# Patient Record
Sex: Male | Born: 1965 | Race: White | Hispanic: No | Marital: Married | Smoking: Never smoker
Health system: Southern US, Community
[De-identification: ages and names within clinical notes are randomized; demographics above are authoritative.]

## PROBLEM LIST (undated history)

## (undated) DIAGNOSIS — M1990 Unspecified osteoarthritis, unspecified site: Secondary | ICD-10-CM

## (undated) HISTORY — PX: COLONOSCOPY: SHX174

---

## 2004-08-03 ENCOUNTER — Emergency Department (HOSPITAL_COMMUNITY): Admission: EM | Admit: 2004-08-03 | Discharge: 2004-08-03 | Payer: Self-pay | Admitting: Family Medicine

## 2005-08-20 DIAGNOSIS — C4492 Squamous cell carcinoma of skin, unspecified: Secondary | ICD-10-CM

## 2005-08-20 HISTORY — DX: Squamous cell carcinoma of skin, unspecified: C44.92

## 2007-03-08 ENCOUNTER — Emergency Department (HOSPITAL_COMMUNITY): Admission: EM | Admit: 2007-03-08 | Discharge: 2007-03-09 | Payer: Self-pay | Admitting: Emergency Medicine

## 2007-04-30 ENCOUNTER — Ambulatory Visit: Payer: Self-pay | Admitting: Internal Medicine

## 2007-05-01 ENCOUNTER — Encounter: Payer: Self-pay | Admitting: Internal Medicine

## 2007-05-06 ENCOUNTER — Encounter (INDEPENDENT_AMBULATORY_CARE_PROVIDER_SITE_OTHER): Payer: Self-pay | Admitting: *Deleted

## 2007-05-06 LAB — CONVERTED CEMR LAB
ALT: 30 units/L (ref 0–53)
AST: 28 units/L (ref 0–37)
Albumin: 4.5 g/dL (ref 3.5–5.2)
Alkaline Phosphatase: 76 units/L (ref 39–117)
BUN: 13 mg/dL (ref 6–23)
Basophils Absolute: 0 10*3/uL (ref 0.0–0.1)
Basophils Relative: 0 % (ref 0–1)
Bilirubin, Direct: 0.1 mg/dL (ref 0.0–0.3)
CO2: 27 meq/L (ref 19–32)
Calcium: 9.2 mg/dL (ref 8.4–10.5)
Chloride: 101 meq/L (ref 96–112)
Cholesterol: 185 mg/dL (ref 0–200)
Creatinine, Ser: 0.86 mg/dL (ref 0.40–1.50)
Eosinophils Absolute: 0.3 10*3/uL (ref 0.0–0.7)
Eosinophils Relative: 3 % (ref 0–5)
Free T4: 1.13 ng/dL (ref 0.89–1.80)
Glucose, Bld: 80 mg/dL (ref 70–99)
HCT: 50.9 % (ref 39.0–52.0)
HDL: 47 mg/dL (ref >39)
Hemoglobin: 16.6 g/dL (ref 13.0–17.0)
Indirect Bilirubin: 0.6 mg/dL (ref 0.0–0.9)
LDL Cholesterol: 115 mg/dL — ABNORMAL HIGH (ref 0–99)
Lymphocytes Relative: 34 % (ref 12–46)
Lymphs Abs: 2.5 10*3/uL (ref 0.7–3.3)
MCHC: 32.6 g/dL (ref 30.0–36.0)
MCV: 94.3 fL (ref 78.0–100.0)
Monocytes Absolute: 0.4 10*3/uL (ref 0.2–0.7)
Monocytes Relative: 6 % (ref 3–11)
Neutro Abs: 4.2 10*3/uL (ref 1.7–7.7)
Neutrophils Relative %: 57 % (ref 43–77)
Platelets: 218 10*3/uL (ref 150–400)
Potassium: 3.8 meq/L (ref 3.5–5.3)
RBC: 5.4 M/uL (ref 4.22–5.81)
RDW: 13.5 % (ref 11.5–14.0)
Sodium: 142 meq/L (ref 135–145)
T3, Free: 3 pg/mL (ref 2.3–4.2)
TSH: 2.486 microintl units/mL (ref 0.350–5.50)
Total Bilirubin: 0.7 mg/dL (ref 0.3–1.2)
Total CHOL/HDL Ratio: 3.9
Total Protein: 8 g/dL (ref 6.0–8.3)
Triglycerides: 115 mg/dL (ref <150)
VLDL: 23 mg/dL (ref 0–40)
WBC: 7.4 10*3/uL (ref 4.0–10.5)

## 2007-05-13 ENCOUNTER — Ambulatory Visit: Payer: Self-pay | Admitting: Cardiology

## 2007-06-18 ENCOUNTER — Ambulatory Visit: Payer: Self-pay | Admitting: Cardiology

## 2009-02-12 IMAGING — CR DG SHOULDER 2+V*L*
3 series · 3 of 3 positions shown · non-contrast
Comparison: none

HISTORY: Left arm and shoulder pain, no known injury

LEFT SHOULDER 3 VIEWS:
Mild bony demineralization diffusely.
AC joint alignment normal.
No glenohumeral fracture or dislocation.
Adjacent ribs intact.

[w shoulder ap internal left]
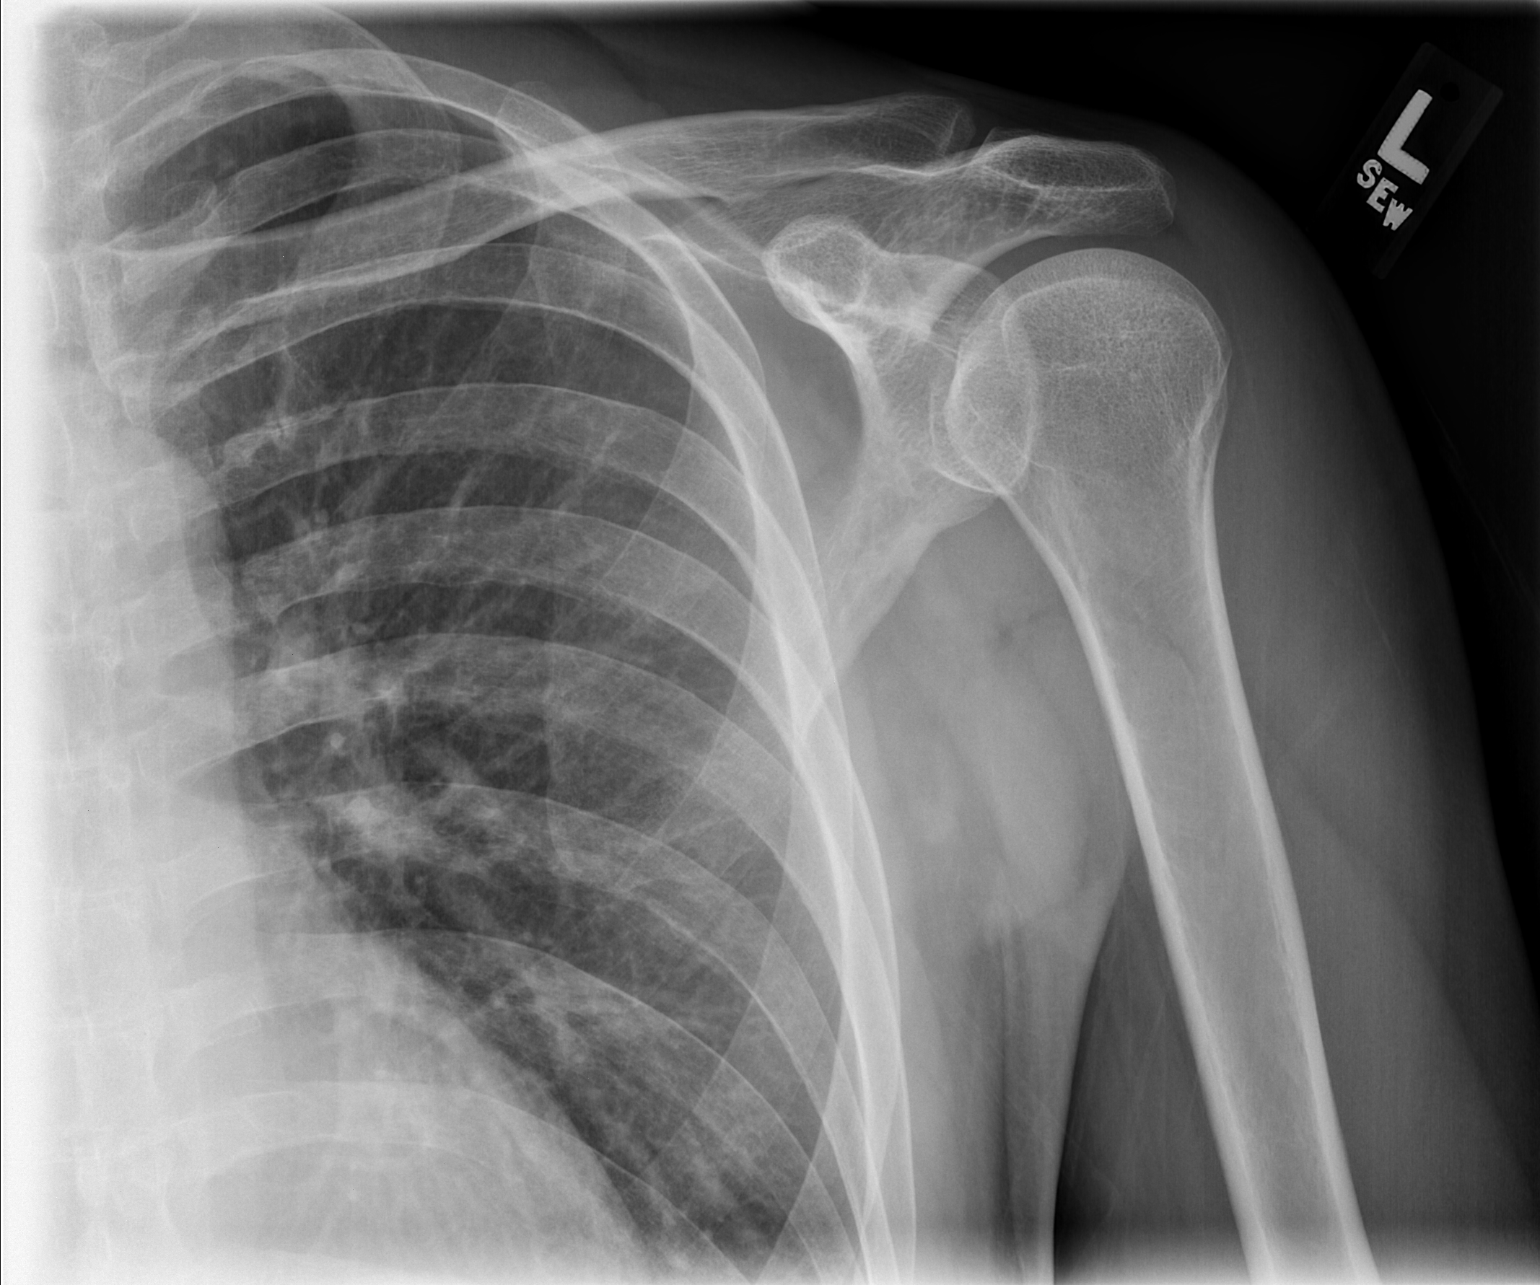

[w shoulder ap external left]
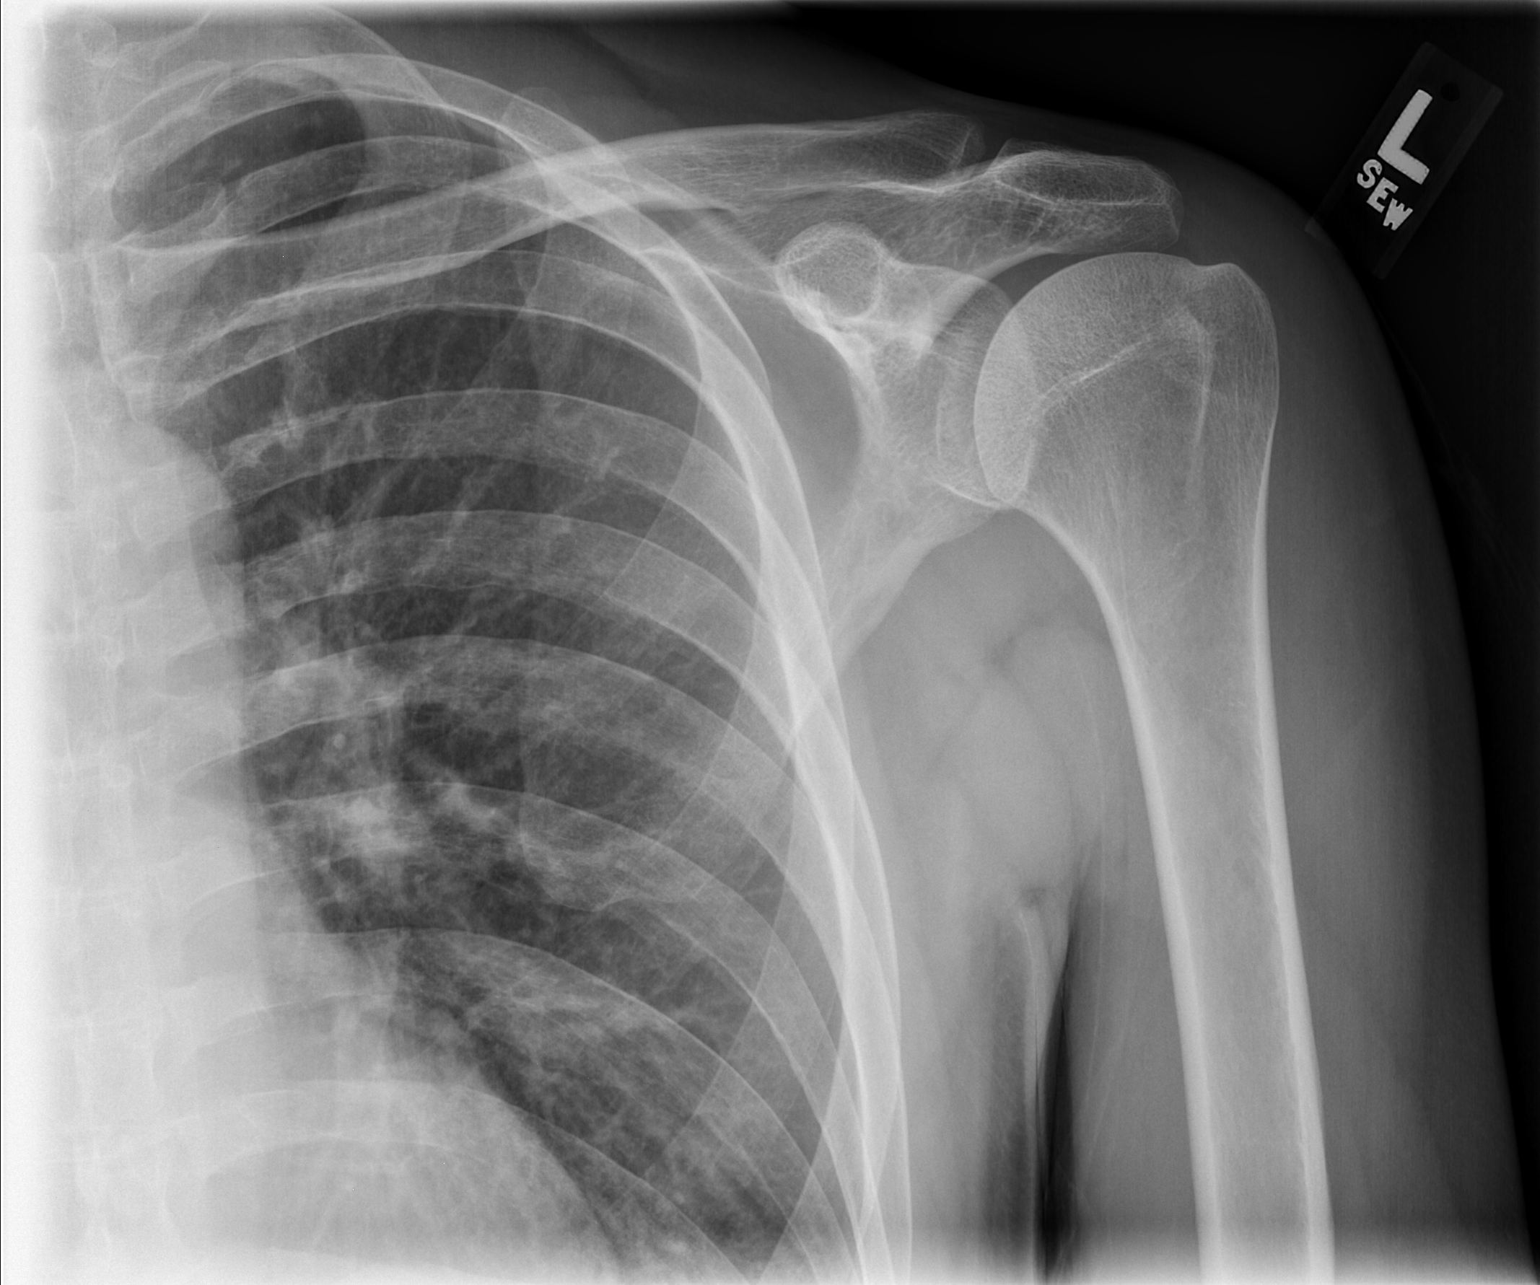

[w shoulder y view left]
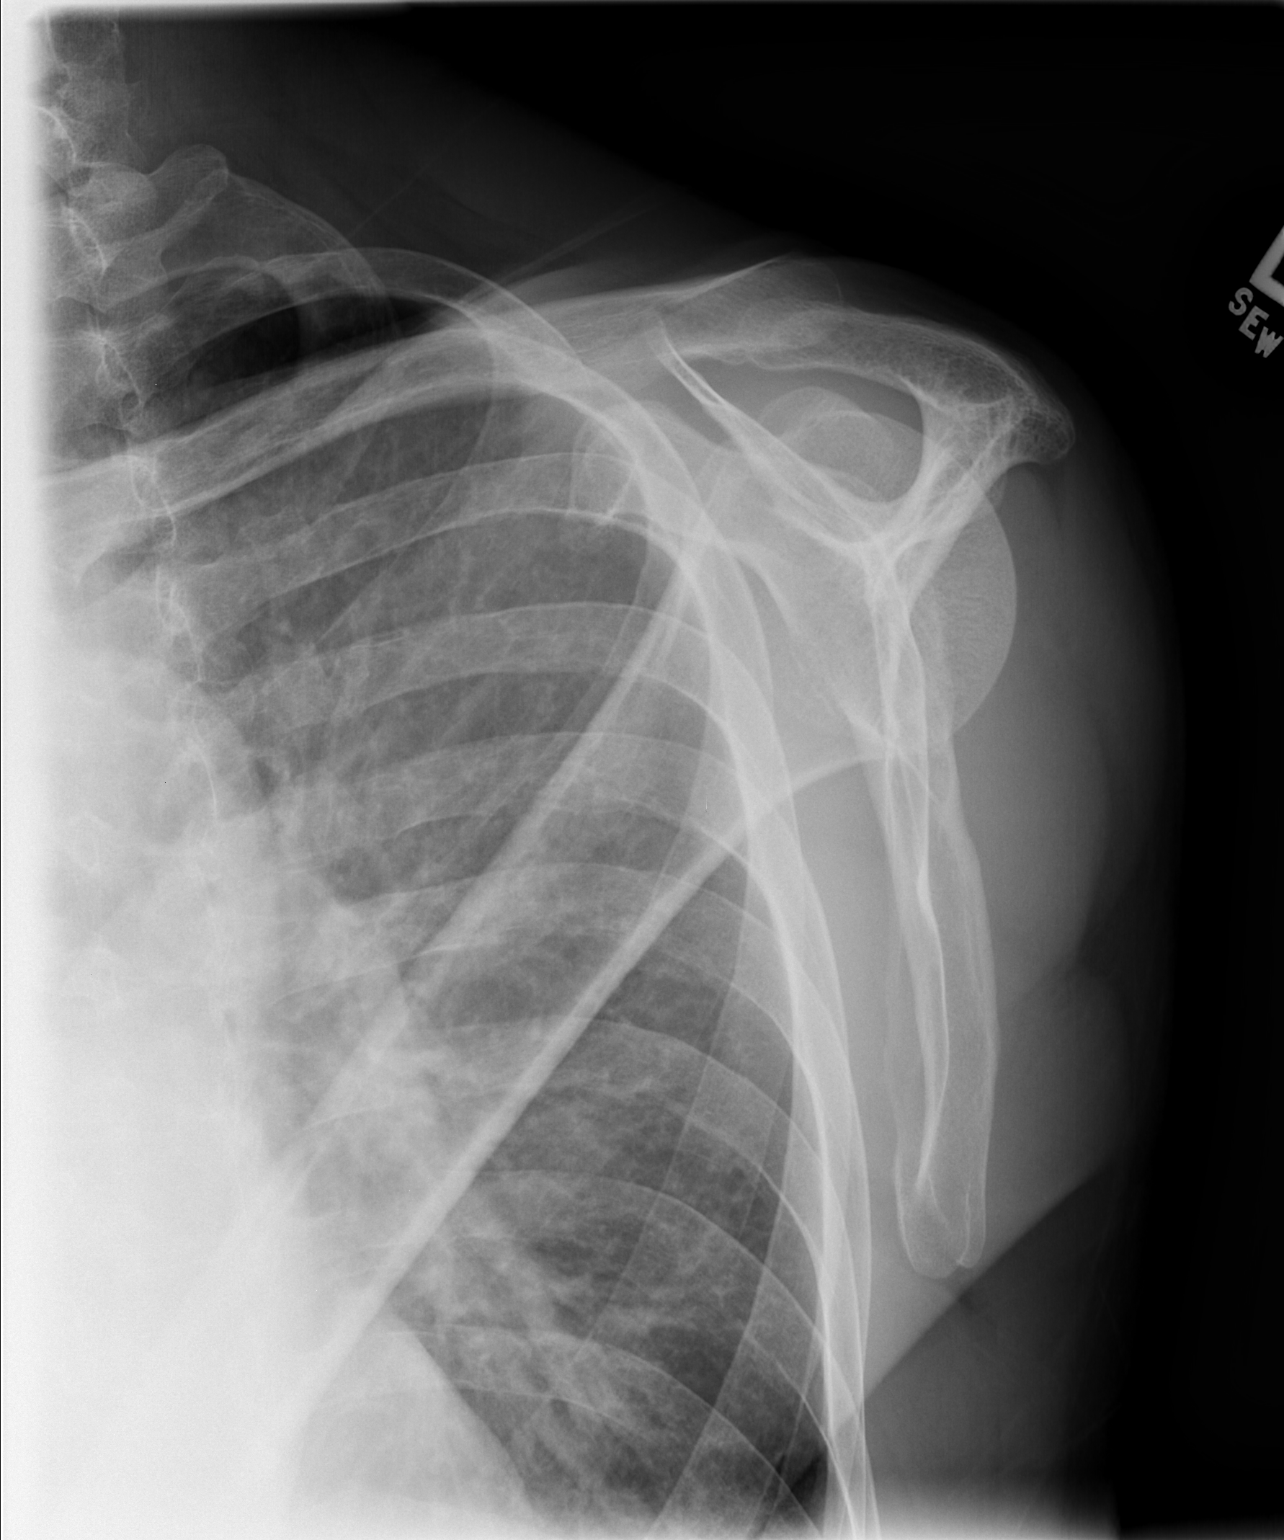

[3 of 3 positions shown; findings below may reference images not displayed]

IMPRESSION: No acute abnormalities.

LEFT HUMERUS 2 VIEWS:

Mild bony demineralization.
No fracture, dislocation, or bone destruction.
Soft tissues unremarkable.
IMPRESSION: No acute abnormalities.
Diffuse bony demineralization.

## 2009-09-12 ENCOUNTER — Ambulatory Visit: Payer: Self-pay | Admitting: Internal Medicine

## 2010-03-26 ENCOUNTER — Ambulatory Visit: Payer: Self-pay | Admitting: Internal Medicine

## 2010-03-26 DIAGNOSIS — E785 Hyperlipidemia, unspecified: Secondary | ICD-10-CM | POA: Insufficient documentation

## 2010-11-17 LAB — CONVERTED CEMR LAB
ALT: 22 units/L (ref 0–53)
AST: 26 units/L (ref 0–37)
Albumin: 4.3 g/dL (ref 3.5–5.2)
Alkaline Phosphatase: 70 units/L (ref 39–117)
BUN: 11 mg/dL (ref 6–23)
Basophils Absolute: 0 10*3/uL (ref 0.0–0.1)
Basophils Relative: 0.3 % (ref 0.0–3.0)
Bilirubin, Direct: 0.2 mg/dL (ref 0.0–0.3)
CO2: 32 meq/L (ref 19–32)
Calcium: 9.1 mg/dL (ref 8.4–10.5)
Chloride: 104 meq/L (ref 96–112)
Cholesterol, target level: 200 mg/dL
Cholesterol: 216 mg/dL — ABNORMAL HIGH (ref 0–200)
Creatinine, Ser: 0.7 mg/dL (ref 0.4–1.5)
Direct LDL: 143.9 mg/dL
Eosinophils Absolute: 0.5 10*3/uL (ref 0.0–0.7)
Eosinophils Relative: 4.9 % (ref 0.0–5.0)
GFR calc non Af Amer: 125.22 mL/min (ref >60)
Glucose, Bld: 85 mg/dL (ref 70–99)
HCT: 46.8 % (ref 39.0–52.0)
HDL goal, serum: 40 mg/dL
HDL: 49.2 mg/dL (ref >39.00)
Hemoglobin: 16.2 g/dL (ref 13.0–17.0)
LDL Goal: 160 mg/dL
Lymphocytes Relative: 25.1 % (ref 12.0–46.0)
Lymphs Abs: 2.3 10*3/uL (ref 0.7–4.0)
MCHC: 34.5 g/dL (ref 30.0–36.0)
MCV: 93.7 fL (ref 78.0–100.0)
Monocytes Absolute: 0.5 10*3/uL (ref 0.1–1.0)
Monocytes Relative: 5.7 % (ref 3.0–12.0)
Neutro Abs: 6 10*3/uL (ref 1.4–7.7)
Neutrophils Relative %: 64 % (ref 43.0–77.0)
Platelets: 181 10*3/uL (ref 150.0–400.0)
Potassium: 4.1 meq/L (ref 3.5–5.1)
RBC: 5 M/uL (ref 4.22–5.81)
RDW: 13.1 % (ref 11.5–14.6)
Sodium: 143 meq/L (ref 135–145)
TSH: 2.08 microintl units/mL (ref 0.35–5.50)
Total Bilirubin: 1.1 mg/dL (ref 0.3–1.2)
Total CHOL/HDL Ratio: 4
Total Protein: 7.2 g/dL (ref 6.0–8.3)
Triglycerides: 107 mg/dL (ref 0.0–149.0)
VLDL: 21.4 mg/dL (ref 0.0–40.0)
WBC: 9.4 10*3/uL (ref 4.5–10.5)

## 2010-11-19 NOTE — Assessment & Plan Note (Signed)
Summary: cpx/ns/kdc   Vital Signs:  Patient profile:   45 year old male Height:      73.5 inches Weight:      165.4 pounds BMI:     21.60 Temp:     98.3 degrees F oral Pulse rate:   64 / minute Resp:     14 per minute BP sitting:   122 / 80  (left arm) Cuff size:   large  Vitals Entered By: Shonna Chock (March 26, 2010 8:33 AM)  CC: CPX with fasting labs , General Medical Evaluation, Lipid Management Comments REVIEWED MED LIST, PATIENT AGREED DOSE AND INSTRUCTION CORRECT    CC:  CPX with fasting labs , General Medical Evaluation, and Lipid Management.  History of Present Illness: Cameron Nguyen is here for a physical; he is asymptomatic.   Lipid Management History:      Negative NCEP/ATP III risk factors include male age less than 38 years old, non-diabetic, no family history for ischemic heart disease, non-tobacco-user status, non-hypertensive, no ASHD (atherosclerotic heart disease), no prior stroke/TIA, no peripheral vascular disease, and no history of aortic aneurysm.     Preventive Screening-Counseling & Management  Alcohol-Tobacco     Smoking Status: quit  Caffeine-Diet-Exercise     Does Patient Exercise: yes  Allergies (verified): No Known Drug Allergies  Past History:  Past Medical History: Unremarkable Hyperlipidemia: LDL 115 in 2008. [place] LDL goal = < 160. No FH premature  CAD.  Past Surgical History: Precancerous facial lesions removed  2006  by  Dr Neena Rhymes, Plastic Surgery, resected a benign facial lesion 2006; Wisdom Teeth extraction  Family History: Father:  HTN; Mother: living; MGM: DM; MGF: CAD ; PGF: CVA;  PGM: pancreatic cancer  Social History: Occupation:General Designer, television/film set Married Former Smoker:quit 1991, former social smoker Alcohol use-no Regular exercise-yes: walk/run 4-5X/week Does Patient Exercise:  yes  Review of Systems  The patient denies anorexia, fever, vision loss, decreased hearing, hoarseness,  chest pain, syncope, peripheral edema, prolonged cough, headaches, hemoptysis, abdominal pain, melena, hematochezia, severe indigestion/heartburn, hematuria, suspicious skin lesions, depression, unusual weight change, abnormal bleeding, enlarged lymph nodes, and angioedema.         Weigh up 11# in past year. Rare dysphagia with meat , every 3-4 months. Some DOE climbing stairs rapidly , not with running up to 10 miles. Occasional LBP  w/o incontinence or paresthesias. S/P steroids by mouth 01/2010  from [facility] for sciatic  pain LLE. Occasional positional  stiffness R knee; no Rx.  Physical Exam  General:  Thin but well-nourished; alert,appropriate and cooperative throughout examination Head:  Normocephalic and atraumatic without obvious abnormalities. No apparent alopecia  Eyes:  No corneal or conjunctival inflammation noted.Perrla. Funduscopic exam benign, without hemorrhages, exudates or papilledema.  Ears:  External ear exam shows no significant lesions or deformities.  Otoscopic examination reveals clear canals, tympanic membranes are intact bilaterally without bulging, retraction, inflammation or discharge. Hearing is grossly normal bilaterally. Nose:  External nasal examination shows no deformity or inflammation. Nasal mucosa are pink and moist without lesions or exudates. Mouth:  Oral mucosa and oropharynx without lesions or exudates.  Teeth in good repair. Osteomata of mandilble Neck:  No deformities, masses, or tenderness noted. Lungs:  Normal respiratory effort, chest expands symmetrically. Lungs are clear to auscultation, no crackles or wheezes. Heart:  Normal rate and regular rhythm. S1 and S2 normal without gallop, murmur, click, rub.S4 Abdomen:  Bowel sounds positive,abdomen soft and non-tender without masses, organomegaly or  hernias noted. Rectal:  No external abnormalities noted. Normal sphincter tone. No rectal masses or tenderness. Genitalia:  Testes bilaterally descended  without nodularity, tenderness or masses. No scrotal masses or lesions. No penis lesions or urethral discharge. Prostate:  Prostate gland firm and smooth, no enlargement but ULN size w/o  nodularity, tenderness, mass, asymmetry or induration. Msk:  No deformity or scoliosis noted of thoracic or lumbar spine.   Pulses:  R and L carotid,radial,dorsalis pedis and posterior tibial pulses are full and equal bilaterally Extremities:  No clubbing, cyanosis, edema, or deformity noted with normal full range of motion of all joints.   Neg SLR Neurologic:  alert & oriented X3, strength normal in all extremities, gait normal, and DTRs symmetrical and normal.   Skin:  Tatoo LUE. Keratosis R forearm  Cervical Nodes:  No lymphadenopathy noted Axillary Nodes:  No palpable lymphadenopathy Inguinal Nodes:  No significant adenopathy Psych:  memory intact for recent and remote, normally interactive, and good eye contact.     Impression & Recommendations:  Problem # 1:  ROUTINE GENERAL MEDICAL EXAM@HEALTH  CARE FACL (ICD-V70.0)  Orders: EKG w/ Interpretation (93000) Venipuncture (98119) TLB-Lipid Panel (80061-LIPID) TLB-BMP (Basic Metabolic Panel-BMET) (80048-METABOL) TLB-CBC Platelet - w/Differential (85025-CBCD) TLB-Hepatic/Liver Function Pnl (80076-HEPATIC) TLB-TSH (Thyroid Stimulating Hormone) (84443-TSH)  Lipid Assessment/Plan:      Based on NCEP/ATP III, the patient's risk factor category is "0-1 risk factors".  The patient's lipid goals are as follows: Total cholesterol goal is 200; LDL cholesterol goal is 160; HDL cholesterol goal is 40; Triglyceride goal is 150.  His LDL cholesterol goal has been met.    Patient Instructions: 1)  Verify family history & make additions/ corrections.

## 2011-03-04 NOTE — Procedures (Signed)
[facility]                              EXERCISE TREADMILL   Cameron Nguyen, Cameron Nguyen                       MRN:          [id number]  DATE:06/18/2007                            DOB:          [birth year]    PRIMARY CARE PHYSICIAN:  Dr. Alwyn Ren.   PROCEDURE:  Exercise treadmill test.   INDICATIONS FOR PROCEDURE:  Evaluate patient with chest pain.   DESCRIPTION OF PROCEDURE:  The patient was exercised using Bruce  protocol. I did accelerate this slightly. He was able to exercise for 12  minutes. This actually took him into stage 5. He achieved 15.3 mets. He  had a peak heart rate of 171, which was 94% of predicted. He had  appropriate blood pressure response to maximum of 154/74. He had a  normal heart rate recovery, no ischemic STT wave changes, and no chest  pain. The test was terminated because of fatigue.   CONCLUSION:  Negative, adequate exercise treadmill test. He had an  excellent exercise tolerance and no high risk features.   PLAN:  Based on the above, the patient is at low risk for obstructive  coronary disease or future cardiovascular events. I did give him a  prescription for exercise based on this. No further cardiac workup for  his chest pain is indicated. He will present to Dr. Alwyn Ren if this  continues, to evaluate probable non-anginal etiologies.     Rollene Rotunda, MD, [facility]  Electronically Signed    JH/MedQ  DD: 06/18/2007  DT: 06/19/2007  Job #: [id number]   cc:   Dr. Alwyn Ren

## 2011-03-04 NOTE — Assessment & Plan Note (Signed)
[facility]                            CARDIOLOGY OFFICE NOTE   Cameron Nguyen, Cameron Nguyen                       MRN:          [id number]  DATE:05/13/2007                            DOB:          1966-04-25    REASON FOR CONSULTATION:  Evaluate patient with chest discomfort and an  abnormal EKG.   HISTORY OF PRESENT ILLNESS:  The patient is a pleasant 45 year old  gentleman with no prior cardiac history.  He was in the emergency room  in May with discomfort.  This was in his left arm.  It was a persistent  discomfort.  He did rule out for myocardial infarction and had no  apparent EKG changes.  It was thought that possibly this was  musculoskeletal.  He has seen Dr. Alwyn Ren for evaluation of some chest  discomfort.  He says this has been going on sporadically for a year.  He  describes some in his left upper chest.  It may persist for up to a half  hour when it comes on.  It may go away very quickly.  He does not  describe any associated symptoms such as nausea, vomiting or  diaphoresis.  He does not have any palpitations with this.  He has had  no presyncope or syncope.  He does occasionally get isolated  palpitations, but this is a chronic pattern.  He can not make this  discomfort come on.  It comes at rest.  He has not had any increase in  this.  It has been a relatively chronic pattern over the last year.  He  did get an EKG that apparently demonstrated an early repolarization  pattern.  The patient is referred for further evaluation as he does have  these complaints and a sedentary lifestyle.   PAST MEDICAL HISTORY:  He has no history hypertension, diabetes or  hyperlipidemia.  He did have anemia many years ago.   PAST SURGICAL HISTORY:  None.   ALLERGIES:  QUESTIONABLY IV CONTRAST DYE (this made him nauseated, but I  do not think it caused a true anaphylactic reaction).   MEDICATIONS:  Multivitamin.   SOCIAL HISTORY:  The patient is a Barista.  He is  married with 2 children.  He has never really smoked cigarettes.  He  does not drink alcohol.   FAMILY HISTORY:  Noncontributory for early coronary artery disease.   REVIEW OF SYSTEMS:  As stated in the history of present illness and  negative for other systems.   PHYSICAL EXAMINATION:  The patient is in no distress.  Blood pressure 118/80, heart rate 67 and regular.  HEENT:  Eyes unremarkable, pupils equal, round and reactive to light,  fundi within normal limits, oral mucosa unremarkable.  NECK:  No jugular venous distension, wave form within normal limits,  carotid upstrokes brisk and symmetric, no bruits, no thyromegaly.  LYMPHATICS:  No cervical, axillary or inguinal adenopathy.  LUNGS:  Clear to auscultation bilaterally.  BACK:  No costovertebral angle tenderness.  CHEST:  Unremarkable.  HEART:  PMI not displaced or sustained, S1 and S2 within  normal limits,  no S3, no S4, no clicks, no rubs, no murmurs.  ABDOMEN:  Flat, positive bowel sounds normal in frequency and pitch, no  bruits, no rebound, no guarding, no midline pulsatile mass, no  hepatomegaly, no splenomegaly.  SKIN:  No rashes, no nodules.  EXTREMITIES:  2+ pulses throughout, no edema, no cyanosis or clubbing.  NEURO:  Oriented to person, place and time, cranial nerves II through  XII grossly intact, motor grossly intact.   EKG:  Sinus rhythm, rate 67, axis within normal limits, intervals within  normal limits, no acute ST-T wave changes.   ASSESSMENT/PLAN:  1. Chest discomfort.  The patient's chest discomfort is atypical.  He      has a low pretest probability for obstructive coronary disease.      However, he has a sedentary lifestyle.  I would like to perform an      exercise treadmill test on him.  This will allow me to screen for      coronary disease.  It will allow me to risk stratify and very      importantly it will allow me to give him a prescription for      exercise.  He and  I discussed this and he agrees to come back for      this.  2. Dyslipidemia.  The patient has a reasonable lipid profile.  He did      have a very slightly elevated LDL and we discussed the fact that      this could come down with diet and exercise.  I will discuss this      further with him at his treadmill.  3. Followup.  I will see him back at the time of his exercise      treadmill test.     Rollene Rotunda, MD, [facility]  Electronically Signed    JH/MedQ  DD: 05/13/2007  DT: 05/14/2007  Job #: [id number]   cc:   Titus Dubin. Alwyn Ren, MD,FACP,FCCP

## 2017-08-19 DIAGNOSIS — C4491 Basal cell carcinoma of skin, unspecified: Secondary | ICD-10-CM

## 2017-08-19 HISTORY — DX: Basal cell carcinoma of skin, unspecified: C44.91

## 2017-11-19 ENCOUNTER — Encounter (HOSPITAL_COMMUNITY): Payer: Self-pay | Admitting: Family Medicine

## 2017-11-19 ENCOUNTER — Ambulatory Visit (HOSPITAL_COMMUNITY)
Admission: EM | Admit: 2017-11-19 | Discharge: 2017-11-19 | Disposition: A | Discharge status: Home / Self Care | Payer: 59 | Attending: Physician Assistant | Admitting: Physician Assistant

## 2017-11-19 DIAGNOSIS — J069 Acute upper respiratory infection, unspecified: Secondary | ICD-10-CM

## 2017-11-19 MED ORDER — NAPROXEN 500 MG PO TABS: 500.0000 mg | ORAL_TABLET | Freq: Two times a day (BID) | ORAL | 0 refills | Status: AC

## 2017-11-19 NOTE — ED Triage Notes (Signed)
Pt here for URI symptoms since Monday. Not taking anything OTC for relief.

## 2017-11-19 NOTE — ED Provider Notes (Signed)
11/19/2017 6:32 PM   DOB: June 29, 1966 / MRN: [id number]  SUBJECTIVE:  Cameron Nguyen is a 52 y.o. male presenting for URI symptoms that started roughly 4 days ago.  Denies fever.  Has not tried any medications.  Tells me that his worst symptom is nasal congestion today.  He has No Known Allergies.   He  has no past medical history on file.    He  reports that  has never smoked. He does not have any smokeless tobacco history on file. He  has no sexual activity history on file. The patient  has no past surgical history on file.  His family history is not on file.  ROS  Negative for fever, chills, diaphoresis, chest pain, shortness of breath, leg swelling  OBJECTIVE:  BP 130/78   Pulse 74   Temp 98.8 F (37.1 C)   Resp 18   SpO2 98%   Physical Exam  Constitutional: He appears well-developed. He is active and cooperative.  Non-toxic appearance.  HENT:  Right Ear: Hearing, tympanic membrane, external ear and ear canal normal.  Left Ear: Hearing, tympanic membrane, external ear and ear canal normal.  Nose: Nose normal. Right sinus exhibits no maxillary sinus tenderness and no frontal sinus tenderness. Left sinus exhibits no maxillary sinus tenderness and no frontal sinus tenderness.  Mouth/Throat: Uvula is midline, oropharynx is clear and moist and mucous membranes are normal. No oropharyngeal exudate, posterior oropharyngeal edema or tonsillar abscesses.  Eyes: Conjunctivae are normal. Pupils are equal, round, and reactive to light.  Cardiovascular: Normal rate, regular rhythm, S1 normal, S2 normal, normal heart sounds, intact distal pulses and normal pulses. Exam reveals no gallop and no friction rub.  No murmur heard. Pulmonary/Chest: Effort normal. No stridor. No tachypnea. No respiratory distress. He has no wheezes. He has no rales.  Abdominal: He exhibits no distension.  Musculoskeletal: He exhibits no edema.  Lymphadenopathy:       Head (right side): No submandibular and no  tonsillar adenopathy present.       Head (left side): No submandibular and no tonsillar adenopathy present.    He has no cervical adenopathy.  Neurological: He is alert. No cranial nerve deficit.  Skin: Skin is warm and dry. He is not diaphoretic. No pallor.  Vitals reviewed.   No results found for this or any previous visit (from the past 72 hour(s)).  No results found.  ASSESSMENT AND PLAN:  No orders of the defined types were placed in this encounter.    Viral URI: Uncomplicated.  Starting the prednisone and advised him that he continue his NyQuil.      The patient is advised to call or return to clinic if he does not see an improvement in symptoms, or to seek the care of the closest emergency department if he worsens with the above plan.   Philis Fendt, MHS, PA-C 11/19/2017 6:32 PM    Tereasa Coop, PA-C 11/19/17 1832

## 2017-11-19 NOTE — Discharge Instructions (Signed)
Take the naproxen as prescribed.  This is a prescription strength NSAID and will give you the most relief of your symptoms.  It is okay to continue taking your NyQuil along with this medication.  I expect he will be 100% better in the next 4-5 days.

## 2018-11-29 DIAGNOSIS — C4492 Squamous cell carcinoma of skin, unspecified: Secondary | ICD-10-CM

## 2018-11-29 HISTORY — DX: Squamous cell carcinoma of skin, unspecified: C44.92

## 2021-01-21 ENCOUNTER — Ambulatory Visit: Payer: 59 | Admitting: Dermatology

## 2021-01-21 ENCOUNTER — Encounter: Payer: Self-pay | Admitting: Dermatology

## 2021-01-21 ENCOUNTER — Other Ambulatory Visit: Payer: Self-pay

## 2021-01-21 DIAGNOSIS — L570 Actinic keratosis: Secondary | ICD-10-CM | POA: Diagnosis not present

## 2021-01-21 DIAGNOSIS — Z85828 Personal history of other malignant neoplasm of skin: Secondary | ICD-10-CM | POA: Diagnosis not present

## 2021-01-21 DIAGNOSIS — D1801 Hemangioma of skin and subcutaneous tissue: Secondary | ICD-10-CM

## 2021-01-21 DIAGNOSIS — L821 Other seborrheic keratosis: Secondary | ICD-10-CM

## 2021-01-21 DIAGNOSIS — D369 Benign neoplasm, unspecified site: Secondary | ICD-10-CM

## 2021-01-21 DIAGNOSIS — Z1283 Encounter for screening for malignant neoplasm of skin: Secondary | ICD-10-CM

## 2021-01-21 DIAGNOSIS — L111 Transient acantholytic dermatosis [Grover]: Secondary | ICD-10-CM

## 2021-01-21 DIAGNOSIS — Z86007 Personal history of in-situ neoplasm of skin: Secondary | ICD-10-CM

## 2021-01-21 MED ORDER — TRIAMCINOLONE ACETONIDE 0.1 % EX CREA
1.0000 | TOPICAL_CREAM | Freq: Two times a day (BID) | CUTANEOUS | 1 refills | Status: DC | PRN
Start: 2021-01-21 — End: 2023-09-21

## 2021-01-21 MED ORDER — TOLAK 4 % EX CREA: TOPICAL_CREAM | CUTANEOUS | 1 refills | Status: DC

## 2021-01-21 NOTE — Patient Instructions (Signed)
Per Dr. Denna Haggard alternate Cameron Nguyen and Triamcinolone if he gets too irritated during treatment.    Triamcinolone on chest for Grover's.

## 2021-02-02 ENCOUNTER — Encounter: Payer: Self-pay | Admitting: Dermatology

## 2021-02-02 NOTE — Progress Notes (Signed)
   Follow-Up Visit   Subjective  Cameron Nguyen is a 55 y.o. male who presents for the following: Annual Exam (Arm and hands scale no real concern).  General skin examination, few new growths Location:  Duration:  Quality:  Associated Signs/Symptoms: Modifying Factors:  Severity:  Timing: Context:   Objective  Well appearing patient in no apparent distress; mood and affect are within normal limits. Objective  Scalp: Full body skin check.:  No atypical moles or nonmelanoma skin cancer  Objective  Left Cheek: No sign recurrence  Objective  Right chest: No sign recurrence  Objective  Left Upper Arm - Anterior, Right Forearm - Anterior: multiple 3 to 6 mm brown flattopped textured papules  Objective  Left Shoulder: Verrucous pedunculated 4 mm papule  Objective  Right Abdomen (side) - Upper: 15 monomorphic 2 to 3 mm slightly edematous pink papules  Objective  Right Abdomen (side) - Upper: Red 1 to 2 mm smooth papules.   Objective  Right Dorsal Hand (2), Right Lower Vermilion Lip: Gritty 3 to 4 mm pink crusts    A full examination was performed including scalp, head, eyes, ears, nose, lips, neck, chest, axillae, abdomen, back, buttocks, bilateral upper extremities, bilateral lower extremities, hands, feet, fingers, toes, fingernails, and toenails. All findings within normal limits unless otherwise noted below.   Assessment & Plan    Screening exam for skin cancer Scalp  Yearly skin check.,  Encouraged to self examine twice annually.  Continued ultraviolet protection.  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  Avoid use on face and body folds.  triamcinolone (KENALOG) 0.1 % - Right Abdomen (side) - Upper  Cherry angioma Right Abdomen (side) - Upper  Stable to leave.   AK (actinic keratosis) (3) Right Dorsal Hand (2); Right Lower Vermilion Lip  Destruction of lesion - Right Dorsal Hand, Right Lower Vermilion Lip Complexity: simple   Destruction method: cryotherapy   Informed consent: discussed and consent obtained   Timeout:  patient name, date of birth, surgical site, and procedure verified Lesion destroyed using liquid nitrogen: Yes   Cryotherapy cycles:  5 Outcome: patient tolerated procedure well with no complications   Post-procedure details: wound care instructions given    Fluorouracil (TOLAK) 4 % CREA - Right Dorsal Hand      I, Lavonna Monarch, MD, have reviewed all documentation for this visit.  The documentation on 02/02/21 for the exam, diagnosis, procedures, and orders are all accurate and complete.

## 2021-08-26 ENCOUNTER — Ambulatory Visit: Payer: 59 | Admitting: Dermatology

## 2021-12-09 ENCOUNTER — Encounter: Payer: Self-pay | Admitting: Dermatology

## 2021-12-09 ENCOUNTER — Ambulatory Visit: Payer: 59 | Admitting: Dermatology

## 2021-12-09 ENCOUNTER — Other Ambulatory Visit: Payer: Self-pay

## 2021-12-09 DIAGNOSIS — L570 Actinic keratosis: Secondary | ICD-10-CM | POA: Diagnosis not present

## 2021-12-09 NOTE — Patient Instructions (Signed)
Niacinimide 500 mg

## 2021-12-10 ENCOUNTER — Encounter: Payer: Self-pay | Admitting: Dermatology

## 2021-12-10 NOTE — Progress Notes (Signed)
° °  Follow-Up Visit   Subjective  Cameron Nguyen is a 56 y.o. male who presents for the following: Photodynamic Therapy (Here for pdt).  Diffuse actinic keratoses face, many thick, for red light PDT with debridement Location:  Duration:  Quality:  Associated Signs/Symptoms: Modifying Factors:  Severity:  Timing: Context:   Objective  Well appearing patient in no apparent distress; mood and affect are within normal limits. Head - Anterior (Face) Patient recalls having 1 or 2 previous bluelight PDT with decent improvement, but he has multiple new actinic keratoses, several hypertrophic so we will debride and use red light PDT.    A focused examination was performed including head and neck. Relevant physical exam findings are noted in the Assessment and Plan.   Assessment & Plan    AK (actinic keratosis) Head - Anterior (Face)  Photodynamic therapy - Head - Anterior (Face) Procedure discussed: discussed risks, benefits, side effects. and alternatives   Prep: site scrubbed/prepped with acetone   Debridement needed: Yes   Location:  Face Type of treatment:  Red light Aminolevulinic Acid (see MAR for details): Ameluz Amount of Ameluz (mg):  2000 Incubation time (minutes):  90 Number of minutes under lamp:  20 Cooling:  Fan and floor fan Outcome: patient tolerated procedure well with no complications   Post-procedure details: sunscreen applied and aftercare instructions given to patient    Related Medications Fluorouracil (TOLAK) 4 % CREA Apply QHS x 28 total application.      I, Lavonna Monarch, MD, have reviewed all documentation for this visit.  The documentation on 12/10/21 for the exam, diagnosis, procedures, and orders are all accurate and complete.

## 2022-02-24 ENCOUNTER — Encounter: Payer: Self-pay | Admitting: Dermatology

## 2022-02-24 ENCOUNTER — Ambulatory Visit: Payer: 59 | Admitting: Dermatology

## 2022-02-24 DIAGNOSIS — L570 Actinic keratosis: Secondary | ICD-10-CM | POA: Diagnosis not present

## 2022-02-24 NOTE — Patient Instructions (Signed)
Otc- nicotinamide ?

## 2022-03-11 ENCOUNTER — Encounter: Payer: Self-pay | Admitting: Dermatology

## 2022-03-11 NOTE — Progress Notes (Signed)
   Follow-Up Visit   Subjective  Cameron Nguyen is a 56 y.o. male who presents for the following: Follow-up (Pdt follow up good reaction redness and peeling, scale to touch lesions include the left eyebrow and the left sideburn).  Actinic keratoses, PDT follow-up Location:  Duration:  Quality:  Associated Signs/Symptoms: Modifying Factors:  Severity:  Timing: Context:   Objective  Well appearing patient in no apparent distress; mood and affect are within normal limits. Head Approximately 60 to 70% decrease in facial actinic keratoses.  No uncovered carcinoma.  No lesions currently requiring freezing.    A focused examination was performed including head and neck. Relevant physical exam findings are noted in the Assessment and Plan.   Assessment & Plan    AK (actinic keratosis) Head  Patient is proactive about his fitness and preventative care.  In the winter he can proceed to use Tolak on the areas with persistent scale; goal will be a total of 28 applications but if nightly application causes too much irritation he can contact me to adjust this regimen.  Related Medications Fluorouracil (TOLAK) 4 % CREA Apply QHS x 28 total application.      I, Lavonna Monarch, MD, have reviewed all documentation for this visit.  The documentation on 03/11/22 for the exam, diagnosis, procedures, and orders are all accurate and complete.

## 2022-05-28 ENCOUNTER — Ambulatory Visit: Payer: 59 | Admitting: Dermatology

## 2022-10-07 ENCOUNTER — Other Ambulatory Visit: Payer: Self-pay | Admitting: Physician Assistant

## 2022-10-07 DIAGNOSIS — E7800 Pure hypercholesterolemia, unspecified: Secondary | ICD-10-CM

## 2022-12-03 ENCOUNTER — Ambulatory Visit
Admission: RE | Admit: 2022-12-03 | Discharge: 2022-12-03 | Disposition: A | Discharge status: Home / Self Care | Payer: 59 | Source: Ambulatory Visit | Attending: Physician Assistant | Admitting: Physician Assistant

## 2022-12-03 DIAGNOSIS — E7800 Pure hypercholesterolemia, unspecified: Secondary | ICD-10-CM

## 2023-05-13 ENCOUNTER — Other Ambulatory Visit: Payer: Self-pay | Admitting: Nurse Practitioner

## 2023-05-13 DIAGNOSIS — R972 Elevated prostate specific antigen [PSA]: Secondary | ICD-10-CM

## 2023-07-02 ENCOUNTER — Other Ambulatory Visit: Payer: 59

## 2023-07-03 ENCOUNTER — Encounter: Payer: Self-pay | Admitting: Nurse Practitioner

## 2023-07-06 ENCOUNTER — Ambulatory Visit
Admission: RE | Admit: 2023-07-06 | Discharge: 2023-07-06 | Disposition: A | Discharge status: Home / Self Care | Payer: BC Managed Care – PPO | Source: Ambulatory Visit | Attending: Nurse Practitioner

## 2023-07-06 DIAGNOSIS — R972 Elevated prostate specific antigen [PSA]: Secondary | ICD-10-CM

## 2023-07-06 MED ORDER — GADOPICLENOL 0.5 MMOL/ML IV SOLN
8.0000 mL | Freq: Once | INTRAVENOUS | Status: AC | PRN
  Administered 2023-07-06: 8 mL via INTRAVENOUS

## 2023-07-24 DIAGNOSIS — R3914 Feeling of incomplete bladder emptying: Secondary | ICD-10-CM | POA: Diagnosis not present

## 2023-07-24 DIAGNOSIS — R972 Elevated prostate specific antigen [PSA]: Secondary | ICD-10-CM | POA: Diagnosis not present

## 2023-07-24 DIAGNOSIS — N401 Enlarged prostate with lower urinary tract symptoms: Secondary | ICD-10-CM | POA: Diagnosis not present

## 2023-09-04 DIAGNOSIS — C61 Malignant neoplasm of prostate: Secondary | ICD-10-CM | POA: Diagnosis not present

## 2023-09-04 DIAGNOSIS — N4289 Other specified disorders of prostate: Secondary | ICD-10-CM | POA: Diagnosis not present

## 2023-09-10 DIAGNOSIS — C61 Malignant neoplasm of prostate: Secondary | ICD-10-CM | POA: Diagnosis not present

## 2023-09-10 DIAGNOSIS — R972 Elevated prostate specific antigen [PSA]: Secondary | ICD-10-CM | POA: Diagnosis not present

## 2023-09-11 ENCOUNTER — Telehealth: Payer: Self-pay | Admitting: Radiation Oncology

## 2023-09-11 NOTE — Telephone Encounter (Signed)
LVM to schedule CON with Dr. Kathrynn Running.

## 2023-09-15 NOTE — Progress Notes (Signed)
GU Location of Tumor / Histology: Prostate Ca  If Prostate Cancer, Gleason Score is (3 + 4) and PSA is (4.11 on 05/07/2023)  Biopsies      07/06/2023 Anne Fu, NP MR Prostate with/without Contrast CLINICAL DATA: Elevated PSA level of 4.11. R97.20   IMPRESSION: 1. Small PI-RADS category 4 lesion of the right posteromedial peripheral zone in the mid gland. 2. Small PI-RADS category 3 lesion of the left posteromedial peripheral zone of the apex. 3.  Targeting data sent to [facility]. 4. Hazy low T2 signal in the peripheral zone is nonfocal, likely postinflammatory, and is considered PI-RADS category 2.   Past/Anticipated interventions by urology, if any:    Dr. Traci Sermon --09/04/2023 Prostate needle biopsy  Past/Anticipated interventions by medical oncology, if any: NA  Weight changes, if any: Denies Wt Readings from Last 3 Encounters:  09/21/23 164 lb 6 oz (74.6 kg)    IPSS: 11 SHIM: 25  Bowel/Bladder complaints, if any: Reports urinary frequency, but otherwise denies any bladder concerns. Denies any changes or concerns with bowel habits  Nausea/Vomiting, if any: Denies  Pain issues, if any:  Denies   SAFETY ISSUES: Prior radiation? No Pacemaker/ICD? No Possible current pregnancy? Male Is the patient on methotrexate? No  Current Complaints / other details:  Nothing else of note

## 2023-09-20 DIAGNOSIS — C61 Malignant neoplasm of prostate: Secondary | ICD-10-CM | POA: Insufficient documentation

## 2023-09-21 ENCOUNTER — Encounter: Payer: Self-pay | Admitting: Radiation Oncology

## 2023-09-21 ENCOUNTER — Ambulatory Visit
Admission: RE | Admit: 2023-09-21 | Discharge: 2023-09-21 | Disposition: A | Discharge status: Home / Self Care | Payer: BC Managed Care – PPO | Source: Ambulatory Visit | Attending: Radiation Oncology | Admitting: Radiation Oncology

## 2023-09-21 VITALS — BP 137/76 | HR 54 | Temp 97.1°F | Resp 18 | Ht 72.0 in | Wt 164.4 lb

## 2023-09-21 DIAGNOSIS — Z191 Hormone sensitive malignancy status: Secondary | ICD-10-CM | POA: Diagnosis not present

## 2023-09-21 DIAGNOSIS — C61 Malignant neoplasm of prostate: Secondary | ICD-10-CM | POA: Diagnosis not present

## 2023-09-21 DIAGNOSIS — Z85828 Personal history of other malignant neoplasm of skin: Secondary | ICD-10-CM | POA: Diagnosis not present

## 2023-09-21 DIAGNOSIS — Z800 Family history of malignant neoplasm of digestive organs: Secondary | ICD-10-CM | POA: Insufficient documentation

## 2023-09-21 NOTE — Progress Notes (Signed)
Introduced myself to the patient, and his wife, as the prostate nurse navigator.  No barriers to care identified at this time.  He is here to discuss his radiation treatment options and will have a surgical consult with Dr. Laverle Patter on 1/14.  I gave him my business card and asked him to call me with questions or concerns.  Verbalized understanding.

## 2023-09-21 NOTE — Progress Notes (Signed)
Radiation Oncology         [phone] ________________________________  Initial Outpatient Consultation  Name: Cameron Nguyen MRN: [id number]  Date: 09/21/2023  DOB: 1966-07-27  WC:[id number], Onalee Hua, MD  Despina Arias, MD   REFERRING PHYSICIAN: Despina Arias, MD  DIAGNOSIS: 57 y.o. gentleman with Stage T1c adenocarcinoma of the prostate with Gleason score of 3+4, and PSA of 4.11.    ICD-10-CM   1. Malignant neoplasm of prostate (HCC)  C61       HISTORY OF PRESENT ILLNESS: Cameron Nguyen is a 57 y.o. male with a diagnosis of prostate cancer. He was noted to have a rising, elevated PSA of 4.85 by his primary care physician, Dr. Azucena Cecil.  PSA trend:  1.49   2017 2.56   2020 3.3   2022 4.36   09/2022 4.85   11/2022 Accordingly, he was referred for evaluation in urology by Dr. Benancio Deeds on 12/22/22,  digital rectal examination performed at that time showed no nodules or induration.  He did have a significant PVR so he was started on Flomax at that time.  A repeat PSA in 04/2023 showed a slight decrease but persistent elevation at 4.11.  He was noticing some slight improvement in his LUTS on Flomax daily so this was increased to twice daily.  Given the persistent elevated PSA, a prostate MRI was performed on 07/06/23 for further evaluation, showing a small PI-RADS 4 lesion in the right posteromedial peripheral zone of the mid gland and a small PI-RADS 3 lesion in the left posteromedial peripheral zone of the apex. The patient proceeded to MRI fusion biopsy of the prostate on 09/04/23.  The prostate volume measured 29.64 cc.  Out of 18 core biopsies, only 1 was positive.  The maximum Gleason score was 3+4, and this was seen in the right base (5% involvement). All other cores, including from both ROI, were benign.  The patient reviewed the biopsy results with his urologist and he has kindly been referred today for discussion of potential radiation treatment options.  His wife, Cameron Nguyen, is  accompanying him for today's visit.   PREVIOUS RADIATION THERAPY: No  PAST MEDICAL HISTORY:  Past Medical History:  Diagnosis Date   Basal cell carcinoma 08/19/2017   right chest   SCC (squamous cell carcinoma) 11/29/2018   left outer eyebrow cx3 70fu   Squamous cell carcinoma of skin 08/20/2005   left cheek cx3 38fu      PAST SURGICAL HISTORY:No past surgical history on file.  FAMILY HISTORY:  Family History  Problem Relation Age of Onset   Multiple myeloma Father    Pancreatic cancer Paternal Grandmother     SOCIAL HISTORY:  Social History   Socioeconomic History   Marital status: Married    Spouse name: Not on file   Number of children: Not on file   Years of education: Not on file   Highest education level: Not on file  Occupational History   Not on file  Tobacco Use   Smoking status: Never   Smokeless tobacco: Never  Vaping Use   Vaping status: Never Used  Substance and Sexual Activity   Alcohol use: Not Currently   Drug use: Never   Sexual activity: Yes  Other Topics Concern   Not on file  Social History Narrative   Not on file   Social Determinants of Health   Financial Resource Strain: Not on file  Food Insecurity: Not on file  Transportation Needs: Not on file  Physical Activity: Not on file  Stress: Not on file  Social Connections: Not on file  Intimate Partner Violence: Not on file    ALLERGIES: Patient has no known allergies.  MEDICATIONS:  No current outpatient medications on file.   No current facility-administered medications for this encounter.    REVIEW OF SYSTEMS:  On review of systems, the patient reports that he is doing well overall. He denies any chest pain, shortness of breath, cough, fevers, chills, night sweats, unintended weight changes. He denies any bowel disturbances, and denies abdominal pain, nausea or vomiting. He denies any new musculoskeletal or joint aches or pains. His IPSS was 11, indicating moderate urinary  symptoms, mainly frequency, despite taking Flomax twice daily. His SHIM was 25, indicating he does not have erectile dysfunction. A complete review of systems is obtained and is otherwise negative.    PHYSICAL EXAM:  Wt Readings from Last 3 Encounters:  09/21/23 164 lb 6 oz (74.6 kg)   Temp Readings from Last 3 Encounters:  09/21/23 (!) 97.1 F (36.2 C) (Temporal)  11/19/17 98.8 F (37.1 C)   BP Readings from Last 3 Encounters:  09/21/23 137/76  11/19/17 130/78   Pulse Readings from Last 3 Encounters:  09/21/23 (!) 54  11/19/17 74    /10  In general this is a well appearing Caucasian male in no acute distress. He's alert and oriented x4 and appropriate throughout the examination. Cardiopulmonary assessment is negative for acute distress, and he exhibits normal effort.     KPS = 100  100 - Normal; no complaints; no evidence of disease. 90   - Able to carry on normal activity; minor signs or symptoms of disease. 80   - Normal activity with effort; some signs or symptoms of disease. 76   - Cares for self; unable to carry on normal activity or to do active work. 60   - Requires occasional assistance, but is able to care for most of his personal needs. 50   - Requires considerable assistance and frequent medical care. 40   - Disabled; requires special care and assistance. 30   - Severely disabled; hospital admission is indicated although death not imminent. 20   - Very sick; hospital admission necessary; active supportive treatment necessary. 10   - Moribund; fatal processes progressing rapidly. 0     - Dead  Karnofsky DA, Abelmann WH, Craver LS and Burchenal [facility] [phone]) The use of the nitrogen mustards in the palliative treatment of carcinoma: with particular reference to bronchogenic carcinoma Cancer 1 634-56  LABORATORY DATA:  Lab Results  Component Value Date   WBC 9.4 03/26/2010   HGB 16.2 03/26/2010   HCT 46.8 03/26/2010   MCV 93.7 03/26/2010   PLT 181.0 03/26/2010    Lab Results  Component Value Date   NA 143 03/26/2010   K 4.1 03/26/2010   CL 104 03/26/2010   CO2 32 03/26/2010   Lab Results  Component Value Date   ALT 22 03/26/2010   AST 26 03/26/2010   ALKPHOS 70 03/26/2010   BILITOT 1.1 03/26/2010     RADIOGRAPHY: No results found.    IMPRESSION/PLAN: 1. 57 y.o. gentleman with Stage T1c adenocarcinoma of the prostate with Gleason Score of 3+4, and PSA of 4.11. We discussed the patient's workup and outlined the nature of prostate cancer in this setting. The patient's T stage, Gleason's score, and PSA put him into the favorable intermediate risk group. Accordingly, he is eligible for a variety of potential  treatment options including active surveillance, brachytherapy, 5.5 weeks of external radiation, or prostatectomy. We discussed the available radiation techniques, and focused on the details and logistics of delivery. We discussed and outlined the risks, benefits, short and long-term effects associated with radiotherapy and compared and contrasted these with prostatectomy. We discussed the role of SpaceOAR gel in reducing the rectal toxicity associated with radiotherapy.   The patient focused most of his questions and interest in robotic-assisted laparoscopic radical prostatectomy.  We discussed some of the potential advantages of surgery including surgical staging, the availability of salvage radiotherapy to the prostatic fossa, and the confidence associated with immediate biochemical response.  We discussed some of the potential proven indications for postoperative radiotherapy including positive margins, extracapsular extension, and seminal vesicle involvement. We also talked about some of the other potential findings leading to a recommendation for radiotherapy including a non-zero postoperative PSA and positive lymph nodes.     He appears to have a good understanding of his disease and our treatment recommendations which are of curative  intent.  He and his wife were encouraged to ask questions that were answered to their stated satisfaction.  At the conclusion of our conversation, the patient remains undecided regarding his treatment preference and is interested in learning more about his surgical options at his upcoming consult visit with Dr. Laverle Patter on 11/03/2023.  He has our contact information and will let us know if he ultimately elects to proceed with one of the radiation treatment options. He knows that he is welcome to call at anytime in the interim with any further questions or concerns regarding the treatment options discussed today.  We enjoyed meeting him and his wife today and look forward to continuing to participate in his care.  We personally spent 70 minutes in this encounter including chart review, reviewing radiological studies, meeting face-to-face with the patient, entering orders and completing documentation.    Marguarite Arbour, PA-C    Margaretmary Dys, MD  [facility]  Radiation Oncology Direct Dial: [phone]  Fax: [phone] [email or web address]  Skype  LinkedIn    This document serves as a record of services personally performed by Margaretmary Dys, MD and Marcello Fennel, PA-C. It was created on their behalf by Mickie Bail, a trained medical scribe. The creation of this record is based on the scribe's personal observations and the provider's statements to them. This document has been checked and approved by the attending provider.

## 2023-10-25 DIAGNOSIS — J069 Acute upper respiratory infection, unspecified: Secondary | ICD-10-CM | POA: Diagnosis not present

## 2023-10-25 DIAGNOSIS — R059 Cough, unspecified: Secondary | ICD-10-CM | POA: Diagnosis not present

## 2023-10-25 DIAGNOSIS — Z6821 Body mass index (BMI) 21.0-21.9, adult: Secondary | ICD-10-CM | POA: Diagnosis not present

## 2023-11-03 DIAGNOSIS — C61 Malignant neoplasm of prostate: Secondary | ICD-10-CM | POA: Diagnosis not present

## 2023-11-04 DIAGNOSIS — C61 Malignant neoplasm of prostate: Secondary | ICD-10-CM | POA: Diagnosis not present

## 2023-11-05 NOTE — Progress Notes (Signed)
RN left message for call back after recent surgical consult to follow up with any treatment decisions.

## 2023-11-12 NOTE — Progress Notes (Signed)
Patient was a recent rad onc consult for his  stage T1c adenocarcinoma of the prostate with Gleason score of 3+4, and PSA of 4.11.   Patient has confirmed he would like to proceed with surgery with Dr. Laverle Patter.   MD's notified, plan of care in progress.

## 2023-11-18 DIAGNOSIS — M6281 Muscle weakness (generalized): Secondary | ICD-10-CM | POA: Diagnosis not present

## 2023-11-18 DIAGNOSIS — C61 Malignant neoplasm of prostate: Secondary | ICD-10-CM | POA: Diagnosis not present

## 2023-12-04 DIAGNOSIS — M6281 Muscle weakness (generalized): Secondary | ICD-10-CM | POA: Diagnosis not present

## 2023-12-04 DIAGNOSIS — M62838 Other muscle spasm: Secondary | ICD-10-CM | POA: Diagnosis not present

## 2023-12-04 DIAGNOSIS — N393 Stress incontinence (female) (male): Secondary | ICD-10-CM | POA: Diagnosis not present

## 2023-12-07 DIAGNOSIS — Z23 Encounter for immunization: Secondary | ICD-10-CM | POA: Diagnosis not present

## 2023-12-07 DIAGNOSIS — Z13220 Encounter for screening for lipoid disorders: Secondary | ICD-10-CM | POA: Diagnosis not present

## 2023-12-07 DIAGNOSIS — Z0000 Encounter for general adult medical examination without abnormal findings: Secondary | ICD-10-CM | POA: Diagnosis not present

## 2023-12-08 ENCOUNTER — Other Ambulatory Visit: Payer: Self-pay | Admitting: Urology

## 2024-01-06 NOTE — Patient Instructions (Signed)
 SURGICAL WAITING ROOM VISITATION  Patients having surgery or a procedure may have no more than 2 support people in the waiting area - these visitors may rotate.    Children under the age of 50 must have an adult with them who is not the patient.  Due to an increase in RSV and influenza rates and associated hospitalizations, children ages 28 and under may not visit patients in [facility] hospitals.  Visitors with respiratory illnesses are discouraged from visiting and should remain at home.  If the patient needs to stay at the hospital during part of their recovery, the visitor guidelines for inpatient rooms apply. Pre-op nurse will coordinate an appropriate time for 1 support person to accompany patient in pre-op.  This support person may not rotate.    Please refer to the [facility] website for the visitor guidelines for Inpatients (after your surgery is over and you are in a regular room).       Your procedure is scheduled on: 01/21/2024    Report to [facility] Main Entrance    Report to admitting at  1015 AM   Call this number if you have problems the morning of surgery [phone]  Magnesium Citrated - 8 ounces at 12 noon day before surgery.                Clear liquid diet the day before surgery.              Fleets enema nite before surgery.               Nothing after midnite.                If you have questions, please contact your surgeon's office.   FOLLOW BOWEL PREP AND ANY ADDITIONAL PRE OP INSTRUCTIONS YOU RECEIVED FROM YOUR SURGEON'S OFFICE!!!     Oral Hygiene is also important to reduce your risk of infection.                                    Remember - BRUSH YOUR TEETH THE MORNING OF SURGERY WITH YOUR REGULAR TOOTHPASTE  DENTURES WILL BE REMOVED PRIOR TO SURGERY PLEASE DO NOT APPLY "Poly grip" OR ADHESIVES!!!   Do NOT smoke after Midnight   Stop all vitamins and herbal supplements 7 days before surgery.   Take these medicines the morning  of surgery with A SIP OF WATER:  none   DO NOT TAKE ANY ORAL DIABETIC MEDICATIONS DAY OF YOUR SURGERY  Bring CPAP mask and tubing day of surgery.                              You may not have any metal on your body including hair pins, jewelry, and body piercing             Do not wear make-up, lotions, powders, perfumes/cologne, or deodorant  Do not wear nail polish including gel and S&S, artificial/acrylic nails, or any other type of covering on natural nails including finger and toenails. If you have artificial nails, gel coating, etc. that needs to be removed by a nail salon please have this removed prior to surgery or surgery may need to be canceled/ delayed if the surgeon/ anesthesia feels like they are unable to be safely monitored.   Do not shave  48 hours prior to surgery.  Men may shave face and neck.   Do not bring valuables to the hospital. [facility] IS NOT             RESPONSIBLE   FOR VALUABLES.   Contacts, glasses, dentures or bridgework may not be worn into surgery.   Bring small overnight bag day of surgery.   DO NOT BRING YOUR HOME MEDICATIONS TO THE HOSPITAL. PHARMACY WILL DISPENSE MEDICATIONS LISTED ON YOUR MEDICATION LIST TO YOU DURING YOUR ADMISSION IN THE HOSPITAL!    Patients discharged on the day of surgery will not be allowed to drive home.  Someone NEEDS to stay with you for the first 24 hours after anesthesia.   Special Instructions: Bring a copy of your healthcare power of attorney and living will documents the day of surgery if you haven't scanned them before.              Please read over the following fact sheets you were given: IF YOU HAVE QUESTIONS ABOUT YOUR PRE-OP INSTRUCTIONS PLEASE CALL [phone]   If you received a COVID test during your pre-op visit  it is requested that you wear a mask when out in public, stay away from anyone that may not be feeling well and notify your surgeon if you develop symptoms. If you test positive  for Covid or have been in contact with anyone that has tested positive in the last 10 days please notify you surgeon.    [facility] - Preparing for Surgery Before surgery, you can play an important role.  Because skin is not sterile, your skin needs to be as free of germs as possible.  You can reduce the number of germs on your skin by washing with CHG (chlorahexidine gluconate) soap before surgery.  CHG is an antiseptic cleaner which kills germs and bonds with the skin to continue killing germs even after washing. Please DO NOT use if you have an allergy to CHG or antibacterial soaps.  If your skin becomes reddened/irritated stop using the CHG and inform your nurse when you arrive at Short Stay. Do not shave (including legs and underarms) for at least 48 hours prior to the first CHG shower.  You may shave your face/neck. Please follow these instructions carefully:  1.  Shower with CHG Soap the night before surgery and the  morning of Surgery.  2.  If you choose to wash your hair, wash your hair first as usual with your  normal  shampoo.  3.  After you shampoo, rinse your hair and body thoroughly to remove the  shampoo.                           4.  Use CHG as you would any other liquid soap.  You can apply chg directly  to the skin and wash                       Gently with a scrungie or clean washcloth.  5.  Apply the CHG Soap to your body ONLY FROM THE NECK DOWN.   Do not use on face/ open                           Wound or open sores. Avoid contact with eyes, ears mouth and genitals (private parts).  Wash face,  Genitals (private parts) with your normal soap.             6.  Wash thoroughly, paying special attention to the area where your surgery  will be performed.  7.  Thoroughly rinse your body with warm water from the neck down.  8.  DO NOT shower/wash with your normal soap after using and rinsing off  the CHG Soap.                9.  Pat yourself dry with a clean  towel.            10.  Wear clean pajamas.            11.  Place clean sheets on your bed the night of your first shower and do not  sleep with pets. Day of Surgery : Do not apply any lotions/deodorants the morning of surgery.  Please wear clean clothes to the hospital/surgery center.  FAILURE TO FOLLOW THESE INSTRUCTIONS MAY RESULT IN THE CANCELLATION OF YOUR SURGERY PATIENT SIGNATURE_________________________________  NURSE SIGNATURE__________________________________  ________________________________________________________________________

## 2024-01-11 ENCOUNTER — Encounter (HOSPITAL_COMMUNITY)
Admission: RE | Admit: 2024-01-11 | Discharge: 2024-01-11 | Disposition: A | Discharge status: Home / Self Care | Payer: BC Managed Care – PPO | Source: Ambulatory Visit | Attending: Urology | Admitting: Urology

## 2024-01-11 ENCOUNTER — Encounter (HOSPITAL_COMMUNITY): Payer: Self-pay

## 2024-01-11 ENCOUNTER — Other Ambulatory Visit: Payer: Self-pay

## 2024-01-11 VITALS — BP 139/93 | HR 59 | Temp 98.1°F | Resp 16 | Ht 72.0 in | Wt 158.0 lb

## 2024-01-11 DIAGNOSIS — Z01818 Encounter for other preprocedural examination: Secondary | ICD-10-CM | POA: Diagnosis not present

## 2024-01-11 HISTORY — DX: Unspecified osteoarthritis, unspecified site: M19.90

## 2024-01-11 LAB — CBC
HCT: 46.1 % (ref 39.0–52.0)
Hemoglobin: 15.7 g/dL (ref 13.0–17.0)
MCH: 31.7 pg (ref 26.0–34.0)
MCHC: 34.1 g/dL (ref 30.0–36.0)
MCV: 92.9 fL (ref 80.0–100.0)
Platelets: 185 10*3/uL (ref 150–400)
RBC: 4.96 MIL/uL (ref 4.22–5.81)
RDW: 12.2 % (ref 11.5–15.5)
WBC: 5.3 10*3/uL (ref 4.0–10.5)
nRBC: 0 % (ref 0.0–0.2)

## 2024-01-11 LAB — BASIC METABOLIC PANEL
Anion gap: 6 (ref 5–15)
BUN: 14 mg/dL (ref 6–20)
CO2: 29 mmol/L (ref 22–32)
Calcium: 8.7 mg/dL — ABNORMAL LOW (ref 8.9–10.3)
Chloride: 103 mmol/L (ref 98–111)
Creatinine, Ser: 0.97 mg/dL (ref 0.61–1.24)
GFR, Estimated: >60 mL/min (ref >60)
Glucose, Bld: 95 mg/dL (ref 70–99)
Potassium: 3.6 mmol/L (ref 3.5–5.1)
Sodium: 138 mmol/L (ref 135–145)

## 2024-01-11 NOTE — Progress Notes (Addendum)
 Anesthesia Review:  PCP: Tally Joe  Cardiologist : none   PPM/ ICD: Device Orders: Rep Notified:  Chest x-ray : EKG : Echo : Stress test: Cardiac Cath :   Activity level: can do a flight of stairs without difficiculty  Sleep Study/ CPAP : none  Fasting Blood Sugar :      / Checks Blood Sugar -- times a day:    Blood Thinner/ Instructions /Last Dose: ASA / Instructions/ Last Dose :    PT is aware of bowel prep instructions from DR [facility] office.  Wife is with pt at preop appt and voices understanding.

## 2024-01-12 DIAGNOSIS — C61 Malignant neoplasm of prostate: Secondary | ICD-10-CM | POA: Diagnosis not present

## 2024-01-20 NOTE — H&P (Signed)
 Office Visit Report     01/12/2024   --------------------------------------------------------------------------------   Cameron Nguyen  MRN: [id number]  DOB: [birth year], 58 year old Male  SSN:    PRIMARY CARE:  Tally Joe, MD  PRIMARY CARE FAX:  [phone]  REFERRING:  Tally Joe, MD  PROVIDER:  Alen Blew. Lafonda Mosses, M.D.  TREATING:  Pec[address] Le[address] [place], [place]  LOCATION:  [facility] [phone]     --------------------------------------------------------------------------------   CC/HPI: Pt presents today for pre-operative history and physical exam in anticipation of robotic assisted lap radical prostatectomy and bilateral pelvic lymph node dissection by Dr. Laverle Patter on 01/21/24. He is doing well and is without complaint.   Pt denies F/C, HA, CP, SOB, N/V, diarrhea/constipation, back pain, flank pain, hematuria, and dysuria.      HX:   CC: Prostate Cancer   Physician requesting consult: Dr. Irine Seal  PCP: Dr. Tally Joe   Cameron Nguyen is a 58 year old gentleman with a history of BPH and high elevated postvoid residual urines who has been treated with tamsulosin. He was found to have an elevated PSA which was confirmed to be elevated at 4.11 prompting an MRI of the prostate on 07/06/2023. This demonstrated 2 regions of interest including 1 lesion measuring 6 mm that was a PI-RADS 4 lesion of the right mid peripheral zone and a second lesion measuring 6 mm that was a PI-RADS 3 lesion at the left apical peripheral zone. He underwent an MR/ultrasound fusion biopsy by Dr. Lafonda Mosses on 09/04/2023. All targeted biopsies were benign. 1 out of 12 biopsy cores systematically was positive for Gleason 3+4 = 7 adenocarcinoma.   Family history: None   Imaging studies: MRI (07/06/2023) - No EPE, SVI, LAD, or bone lesions.   PMH: He has a history of hyperlipidemia.  PSH: No abdominal surgeries.   TNM stage: cT1c N0 Mx  PSA: 4.11  Gleason score: 3+4 = 7 (grade group 2)   Biopsy (09/04/2023): 1/18 cores positive  Left: Benign  Right: Right base (5%, 3+4 = 7)  ROI-1: Benign  ROI-2: Benign  Prostate volume: 29.6 cc  PSAD: 0.14   Nomogram  OC disease: 86%  EPE: 13%  SVI: 1%  LNI: 1%  PFS (5 year, 10 year): 92%, 85%   Urinary function: IPSS is 13. He had previously taken tamsulosin but did not notice any symptomatic benefit and has since stopped this medication. He has a history of elevated PVRs.  Erectile function: SHIM score is 25.     ALLERGIES: None   MEDICATIONS: Aspirin  Tamsulosin HCl 0.4 MG Capsule 1 capsule PO BID     GU PSH: Prostate Needle Biopsy - 09/04/2023       PSH Notes: skin cancer removed from face and chest    NON-GU PSH: Colonoscopy Surgical Pathology, Gross And Microscopic Examination For Prostate Needle - 09/04/2023     GU PMH: Stress Incontinence - 12/04/2023 Prostate Cancer - 11/18/2023, - 11/03/2023, - 09/10/2023 Elevated PSA - 09/10/2023, - 09/04/2023, - 07/24/2023, - 05/13/2023, - 02/09/2023, - 12/22/2022 BPH w/LUTS - 07/24/2023, - 05/13/2023, - 02/09/2023, - 12/22/2022 Incomplete bladder emptying - 07/24/2023, - 05/13/2023, - 02/09/2023, - 12/22/2022      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   Tobacco Use Assessment Completed: Used Tobacco in last 30 days? Does not use smokeless tobacco. Has never drank.  Does not use drugs. Drinks 1 caffeinated drink per day. Has not had a blood  transfusion.    REVIEW OF SYSTEMS:    GU Review Male:   Patient denies frequent urination, hard to postpone urination, burning/ pain with urination, get up at night to urinate, leakage of urine, stream starts and stops, trouble starting your stream, have to strain to urinate , erection problems, and penile pain.  Gastrointestinal (Upper):   Patient denies nausea, vomiting, and indigestion/ heartburn.  Gastrointestinal (Lower):   Patient denies diarrhea and constipation.  Constitutional:   Patient denies fever, night sweats, weight loss, and fatigue.  Skin:   Patient denies skin rash/ lesion and itching.  Eyes:   Patient denies blurred vision and double vision.  Ears/ Nose/ Throat:   Patient denies sore throat and sinus problems.  Hematologic/Lymphatic:   Patient denies swollen glands and easy bruising.  Cardiovascular:   Patient denies leg swelling and chest pains.  Respiratory:   Patient denies cough and shortness of breath.  Endocrine:   Patient denies excessive thirst.  Musculoskeletal:   Patient denies back pain and joint pain.  Neurological:   Patient denies headaches and dizziness.  Psychologic:   Patient denies depression and anxiety.   VITAL SIGNS:      01/12/2024 02:29 PM  Weight 158 lb / 71.67 kg  Height 72 in / 182.88 cm  BP 115/68 mmHg  Pulse 65 /min  Temperature 97.5 F / 36.3 C  BMI 21.4 kg/m   MULTI-SYSTEM PHYSICAL EXAMINATION:    Constitutional: Well-nourished. No physical deformities. Normally developed. Good grooming.  Neck: Neck symmetrical, not swollen. Normal tracheal position.  Respiratory: Normal breath sounds. No labored breathing, no use of accessory muscles.   Cardiovascular: Regular rate and rhythm. No murmur, no gallop.   Lymphatic: No enlargement of neck, axillae, groin.  Skin: No paleness, no jaundice, no cyanosis. No lesion, no ulcer, no rash.  Neurologic / Psychiatric: Oriented to time, oriented to place, oriented to person. No depression, no  anxiety, no agitation.  Gastrointestinal: No mass, no tenderness, no rigidity, non obese abdomen.  Eyes: Normal conjunctivae. Normal eyelids.  Ears, Nose, Mouth, and Throat: Left ear no scars, no lesions, no masses. Right ear no scars, no lesions, no masses. Nose no scars, no lesions, no masses. Normal hearing. Normal lips.  Musculoskeletal: Normal gait and station of head and neck.     Complexity of Data:  Records Review:   Previous Patient Records  Urine Test Review:   Urinalysis   01/12/24  Urinalysis  Urine Appearance Clear   Urine Color Yellow   Urine Glucose Neg mg/dL  Urine Bilirubin Neg mg/dL  Urine Ketones Neg mg/dL  Urine Specific Gravity 1.010   Urine Blood Neg ery/uL  Urine pH 6.5   Urine Protein Neg mg/dL  Urine Urobilinogen 0.2 mg/dL  Urine Nitrites Neg   Urine Leukocyte Esterase Neg leu/uL   PROCEDURES:          Urinalysis - 81003 Dipstick Dipstick Cont'd  Color: Yellow Bilirubin: Neg mg/dL  Appearance: Clear Ketones: Neg mg/dL  Specific Gravity: 1.610 Blood: Neg ery/uL  pH: 6.5 Protein: Neg mg/dL  Glucose: Neg  mg/dL Urobilinogen: 0.2 mg/dL    Nitrites: Neg    Leukocyte Esterase: Neg leu/uL    ASSESSMENT:      ICD-10 Details  1 GU:   Prostate Cancer - C61    PLAN:           Schedule Return Visit/Planned Activity: Keep Scheduled Appointment - Schedule Surgery          Document Letter(s):  Created for Patient: Clinical Summary         Notes:   There are no changes in the patients history or physical exam since last evaluation by Dr. Laverle Patter. Pt is scheduled to undergo RALP with BPLND on 01/21/24.   All pt's questions were answered to the best of my ability.          Next Appointment:      Next Appointment: 01/21/2024 12:15 PM    Appointment Type: Surgery     Location: [facility] [phone]    Provider: Heloise Purpura, M.D.    Reason for Visit: WL/OBS RA LAP RAD PROSTATECTOMY LEV 2 AND BPLND WITH AMANDA $500 due 03/27       * Signed by Ulyses Amor, PA on 01/12/24 at 2:41 PM (EDT)*

## 2024-01-21 ENCOUNTER — Other Ambulatory Visit: Payer: Self-pay

## 2024-01-21 ENCOUNTER — Ambulatory Visit (HOSPITAL_COMMUNITY): Payer: Self-pay | Admitting: Physician Assistant

## 2024-01-21 ENCOUNTER — Encounter (HOSPITAL_COMMUNITY)
Admission: RE | Disposition: A | Discharge status: Home / Self Care | Payer: Self-pay | Source: Ambulatory Visit | Attending: Urology

## 2024-01-21 ENCOUNTER — Encounter (HOSPITAL_COMMUNITY): Payer: Self-pay | Admitting: Urology

## 2024-01-21 ENCOUNTER — Observation Stay (HOSPITAL_COMMUNITY)
Admission: RE | Admit: 2024-01-21 | Discharge: 2024-01-22 | Disposition: A | Discharge status: Home / Self Care | Payer: BC Managed Care – PPO | Source: Ambulatory Visit | Attending: Urology | Admitting: Urology

## 2024-01-21 ENCOUNTER — Ambulatory Visit (HOSPITAL_COMMUNITY): Admitting: Anesthesiology

## 2024-01-21 DIAGNOSIS — C61 Malignant neoplasm of prostate: Secondary | ICD-10-CM | POA: Diagnosis not present

## 2024-01-21 DIAGNOSIS — Z01818 Encounter for other preprocedural examination: Secondary | ICD-10-CM

## 2024-01-21 HISTORY — PX: ROBOT ASSISTED LAPAROSCOPIC RADICAL PROSTATECTOMY: SHX5141

## 2024-01-21 HISTORY — PX: LYMPHADENECTOMY: SHX5960

## 2024-01-21 LAB — HEMOGLOBIN AND HEMATOCRIT, BLOOD
HCT: 13.7 % — ABNORMAL LOW (ref 39.0–52.0)
HCT: 43.6 % (ref 39.0–52.0)
Hemoglobin: 4.2 g/dL — CL (ref 13.0–17.0)
Hemoglobin: 15.2 g/dL (ref 13.0–17.0)

## 2024-01-21 LAB — TYPE AND SCREEN
ABO/RH(D): O POS
Antibody Screen: NEGATIVE

## 2024-01-21 LAB — ABO/RH: ABO/RH(D): O POS

## 2024-01-21 SURGERY — XI ROBOTIC ASSISTED LAPAROSCOPIC RADICAL PROSTATECTOMY LEVEL 2
Anesthesia: General

## 2024-01-21 MED ORDER — STERILE WATER FOR IRRIGATION IR SOLN
Status: DC | PRN
  Administered 2024-01-21: 1000 mL

## 2024-01-21 MED ORDER — ACETAMINOPHEN 160 MG/5ML PO SOLN: 325.0000 mg | Freq: Once | ORAL | Status: DC | PRN

## 2024-01-21 MED ORDER — FENTANYL CITRATE (PF) 250 MCG/5ML IJ SOLN
INTRAMUSCULAR | Status: AC
  Filled 2024-01-21: qty 5

## 2024-01-21 MED ORDER — SULFAMETHOXAZOLE-TRIMETHOPRIM 800-160 MG PO TABS: 1.0000 | ORAL_TABLET | Freq: Two times a day (BID) | ORAL | 0 refills | Status: DC

## 2024-01-21 MED ORDER — MORPHINE SULFATE (PF) 2 MG/ML IV SOLN: 2.0000 mg | INTRAVENOUS | Status: DC | PRN

## 2024-01-21 MED ORDER — BUPIVACAINE-EPINEPHRINE (PF) 0.25% -1:200000 IJ SOLN
INTRAMUSCULAR | Status: AC
  Filled 2024-01-21: qty 30

## 2024-01-21 MED ORDER — ROCURONIUM BROMIDE 10 MG/ML (PF) SYRINGE
PREFILLED_SYRINGE | INTRAVENOUS | Status: DC | PRN
  Administered 2024-01-21: 60 mg via INTRAVENOUS
  Administered 2024-01-21: 20 mg via INTRAVENOUS

## 2024-01-21 MED ORDER — LIDOCAINE 2% (20 MG/ML) 5 ML SYRINGE
INTRAMUSCULAR | Status: DC | PRN
  Administered 2024-01-21: 100 mg via INTRAVENOUS

## 2024-01-21 MED ORDER — FENTANYL CITRATE (PF) 100 MCG/2ML IJ SOLN
INTRAMUSCULAR | Status: DC | PRN
  Administered 2024-01-21: 100 ug via INTRAVENOUS
  Administered 2024-01-21 (×3): 50 ug via INTRAVENOUS

## 2024-01-21 MED ORDER — ONDANSETRON HCL 4 MG/2ML IJ SOLN
INTRAMUSCULAR | Status: DC | PRN
  Administered 2024-01-21: 4 mg via INTRAVENOUS

## 2024-01-21 MED ORDER — DEXAMETHASONE SODIUM PHOSPHATE 10 MG/ML IJ SOLN
INTRAMUSCULAR | Status: DC | PRN
  Administered 2024-01-21: 10 mg via INTRAVENOUS

## 2024-01-21 MED ORDER — FLEET ENEMA RE ENEM: 1.0000 | ENEMA | Freq: Once | RECTAL | Status: DC

## 2024-01-21 MED ORDER — TRIPLE ANTIBIOTIC 3.5-400-5000 EX OINT: 1.0000 | TOPICAL_OINTMENT | Freq: Three times a day (TID) | CUTANEOUS | Status: DC | PRN

## 2024-01-21 MED ORDER — CHLORHEXIDINE GLUCONATE 0.12 % MT SOLN
15.0000 mL | Freq: Once | OROMUCOSAL | Status: AC
  Administered 2024-01-21: 15 mL via OROMUCOSAL

## 2024-01-21 MED ORDER — MORPHINE SULFATE (PF) 2 MG/ML IV SOLN: 1.0000 mg | INTRAVENOUS | Status: DC | PRN

## 2024-01-21 MED ORDER — ONDANSETRON HCL 4 MG/2ML IJ SOLN: 4.0000 mg | INTRAMUSCULAR | Status: DC | PRN

## 2024-01-21 MED ORDER — DROPERIDOL 2.5 MG/ML IJ SOLN: 0.6250 mg | Freq: Once | INTRAMUSCULAR | Status: DC | PRN

## 2024-01-21 MED ORDER — DOCUSATE SODIUM 100 MG PO CAPS
100.0000 mg | ORAL_CAPSULE | Freq: Two times a day (BID) | ORAL | Status: DC
  Administered 2024-01-21 – 2024-01-22 (×2): 100 mg via ORAL
  Filled 2024-01-21 (×2): qty 1

## 2024-01-21 MED ORDER — ACETAMINOPHEN 325 MG PO TABS: 325.0000 mg | ORAL_TABLET | Freq: Once | ORAL | Status: DC | PRN

## 2024-01-21 MED ORDER — KETOROLAC TROMETHAMINE 15 MG/ML IJ SOLN
15.0000 mg | Freq: Four times a day (QID) | INTRAMUSCULAR | Status: DC
  Administered 2024-01-21 – 2024-01-22 (×4): 15 mg via INTRAVENOUS
  Filled 2024-01-21 (×4): qty 1

## 2024-01-21 MED ORDER — MIDAZOLAM HCL 5 MG/5ML IJ SOLN
INTRAMUSCULAR | Status: DC | PRN
  Administered 2024-01-21: 2 mg via INTRAVENOUS

## 2024-01-21 MED ORDER — ZOLPIDEM TARTRATE 5 MG PO TABS: 5.0000 mg | ORAL_TABLET | Freq: Every evening | ORAL | Status: DC | PRN

## 2024-01-21 MED ORDER — DOCUSATE SODIUM 100 MG PO CAPS: 100.0000 mg | ORAL_CAPSULE | Freq: Two times a day (BID) | ORAL | Status: DC

## 2024-01-21 MED ORDER — LACTATED RINGERS IV SOLN
INTRAVENOUS | Status: DC
Start: 2024-01-21 — End: 2024-01-21

## 2024-01-21 MED ORDER — ACETAMINOPHEN 325 MG PO TABS: 650.0000 mg | ORAL_TABLET | ORAL | Status: DC | PRN

## 2024-01-21 MED ORDER — PROPOFOL 10 MG/ML IV BOLUS
INTRAVENOUS | Status: DC | PRN
  Administered 2024-01-21: 150 mg via INTRAVENOUS

## 2024-01-21 MED ORDER — ACETAMINOPHEN 10 MG/ML IV SOLN: 1000.0000 mg | Freq: Once | INTRAVENOUS | Status: DC | PRN

## 2024-01-21 MED ORDER — KCL IN DEXTROSE-NACL 20-5-0.45 MEQ/L-%-% IV SOLN
INTRAVENOUS | Status: DC
  Filled 2024-01-21 (×3): qty 1000

## 2024-01-21 MED ORDER — DIPHENHYDRAMINE HCL 12.5 MG/5ML PO ELIX: 12.5000 mg | ORAL_SOLUTION | Freq: Four times a day (QID) | ORAL | Status: DC | PRN

## 2024-01-21 MED ORDER — SODIUM CHLORIDE 0.9 % IV BOLUS
1000.0000 mL | Freq: Once | INTRAVENOUS | Status: AC
  Administered 2024-01-21: 1000 mL via INTRAVENOUS

## 2024-01-21 MED ORDER — MAGNESIUM CITRATE PO SOLN: 1.0000 | Freq: Once | ORAL | Status: DC

## 2024-01-21 MED ORDER — MEPERIDINE HCL 50 MG/ML IJ SOLN: 6.2500 mg | INTRAMUSCULAR | Status: DC | PRN

## 2024-01-21 MED ORDER — TRAMADOL HCL 50 MG PO TABS: 50.0000 mg | ORAL_TABLET | Freq: Four times a day (QID) | ORAL | 0 refills | Status: DC | PRN

## 2024-01-21 MED ORDER — ROCURONIUM BROMIDE 10 MG/ML (PF) SYRINGE
PREFILLED_SYRINGE | INTRAVENOUS | Status: AC
  Filled 2024-01-21: qty 40

## 2024-01-21 MED ORDER — DIPHENHYDRAMINE HCL 50 MG/ML IJ SOLN: 12.5000 mg | Freq: Four times a day (QID) | INTRAMUSCULAR | Status: DC | PRN

## 2024-01-21 MED ORDER — HYDROMORPHONE HCL 1 MG/ML IJ SOLN: 0.2500 mg | INTRAMUSCULAR | Status: DC | PRN

## 2024-01-21 MED ORDER — CEFAZOLIN SODIUM-DEXTROSE 1-4 GM/50ML-% IV SOLN
1.0000 g | Freq: Three times a day (TID) | INTRAVENOUS | Status: AC
  Administered 2024-01-21 – 2024-01-22 (×2): 1 g via INTRAVENOUS
  Filled 2024-01-21 (×2): qty 50

## 2024-01-21 MED ORDER — LACTATED RINGERS IV SOLN: INTRAVENOUS | Status: DC | PRN

## 2024-01-21 MED ORDER — ORAL CARE MOUTH RINSE: 15.0000 mL | Freq: Once | OROMUCOSAL | Status: AC

## 2024-01-21 MED ORDER — MIDAZOLAM HCL 2 MG/2ML IJ SOLN
INTRAMUSCULAR | Status: AC
  Filled 2024-01-21: qty 2

## 2024-01-21 MED ORDER — CEFAZOLIN SODIUM-DEXTROSE 2-4 GM/100ML-% IV SOLN
2.0000 g | INTRAVENOUS | Status: AC
  Administered 2024-01-21: 2 g via INTRAVENOUS
  Filled 2024-01-21: qty 100

## 2024-01-21 MED ORDER — SUGAMMADEX SODIUM 200 MG/2ML IV SOLN
INTRAVENOUS | Status: DC | PRN
  Administered 2024-01-21: 200 mg via INTRAVENOUS

## 2024-01-21 MED ORDER — LACTATED RINGERS IV SOLN: INTRAVENOUS | Status: DC

## 2024-01-21 MED ORDER — HYOSCYAMINE SULFATE 0.125 MG SL SUBL: 0.1250 mg | SUBLINGUAL_TABLET | Freq: Four times a day (QID) | SUBLINGUAL | Status: DC | PRN

## 2024-01-21 MED ORDER — BUPIVACAINE-EPINEPHRINE 0.25% -1:200000 IJ SOLN
INTRAMUSCULAR | Status: DC | PRN
  Administered 2024-01-21: 30 mL

## 2024-01-21 SURGICAL SUPPLY — 58 items
APPLICATOR COTTON TIP 6 STRL (MISCELLANEOUS) ×2 IMPLANT
APPLICATOR COTTON TIP 6IN STRL (MISCELLANEOUS) ×2 IMPLANT
BAG COUNTER SPONGE SURGICOUNT (BAG) IMPLANT
CATH FOLEY 2WAY SLVR 18FR 30CC (CATHETERS) ×2 IMPLANT
CATH ROBINSON RED A/P 16FR (CATHETERS) ×2 IMPLANT
CATH ROBINSON RED A/P 8FR (CATHETERS) ×2 IMPLANT
CATH TIEMANN FOLEY 18FR 5CC (CATHETERS) ×2 IMPLANT
CHLORAPREP W/TINT 26 (MISCELLANEOUS) ×2 IMPLANT
CLIP LIGATING HEM O LOK PURPLE (MISCELLANEOUS) ×2 IMPLANT
COVER SURGICAL LIGHT HANDLE (MISCELLANEOUS) ×2 IMPLANT
COVER TIP SHEARS 8 DVNC (MISCELLANEOUS) ×2 IMPLANT
CUTTER ECHEON FLEX ENDO 45 340 (ENDOMECHANICALS) ×2 IMPLANT
DERMABOND ADVANCED .7 DNX12 (GAUZE/BANDAGES/DRESSINGS) ×2 IMPLANT
DRAIN CHANNEL RND F F (WOUND CARE) IMPLANT
DRAPE ARM DVNC X/XI (DISPOSABLE) ×8 IMPLANT
DRAPE COLUMN DVNC XI (DISPOSABLE) ×2 IMPLANT
DRAPE SURG IRRIG POUCH 19X23 (DRAPES) ×2 IMPLANT
DRIVER NDL LRG 8 DVNC XI (INSTRUMENTS) ×4 IMPLANT
DRIVER NDLE LRG 8 DVNC XI (INSTRUMENTS) ×4 IMPLANT
DRSG TEGADERM 4X4.75 (GAUZE/BANDAGES/DRESSINGS) ×2 IMPLANT
ELECT PENCIL ROCKER SW 15FT (MISCELLANEOUS) ×2 IMPLANT
ELECT REM PT RETURN 15FT ADLT (MISCELLANEOUS) ×2 IMPLANT
FORCEPS BPLR LNG DVNC XI (INSTRUMENTS) ×2 IMPLANT
FORCEPS PROGRASP DVNC XI (FORCEP) ×2 IMPLANT
GAUZE SPONGE 4X4 12PLY STRL (GAUZE/BANDAGES/DRESSINGS) ×2 IMPLANT
GLOVE BIO SURGEON STRL SZ 6.5 (GLOVE) ×2 IMPLANT
GLOVE SURG LX STRL 7.5 STRW (GLOVE) ×4 IMPLANT
GOWN STRL REUS W/ TWL XL LVL3 (GOWN DISPOSABLE) ×4 IMPLANT
GOWN STRL SURGICAL XL XLNG (GOWN DISPOSABLE) ×2 IMPLANT
HOLDER FOLEY CATH W/STRAP (MISCELLANEOUS) ×2 IMPLANT
IRRIG SUCT STRYKERFLOW 2 WTIP (MISCELLANEOUS) ×2 IMPLANT
IRRIGATION SUCT STRKRFLW 2 WTP (MISCELLANEOUS) ×2 IMPLANT
IV LACTATED RINGERS 1000ML (IV SOLUTION) ×2 IMPLANT
KIT TURNOVER KIT A (KITS) IMPLANT
NDL SAFETY ECLIPSE 18X1.5 (NEEDLE) IMPLANT
PACK ROBOT UROLOGY CUSTOM (CUSTOM PROCEDURE TRAY) ×2 IMPLANT
PLUG CATH AND CAP STRL 200 (CATHETERS) ×2 IMPLANT
RELOAD STAPLE 45 4.1 GRN THCK (STAPLE) ×2 IMPLANT
SCISSORS MNPLR CVD DVNC XI (INSTRUMENTS) ×2 IMPLANT
SEAL UNIV 5-12 XI (MISCELLANEOUS) ×8 IMPLANT
SET CYSTO W/LG BORE CLAMP LF (SET/KITS/TRAYS/PACK) IMPLANT
SET TUBE SMOKE EVAC HIGH FLOW (TUBING) ×2 IMPLANT
SOL ELECTROSURG ANTI STICK (MISCELLANEOUS) ×2 IMPLANT
SOL PREP POV-IOD 4OZ 10% (MISCELLANEOUS) ×2 IMPLANT
SOLUTION ELECTROSURG ANTI STCK (MISCELLANEOUS) ×2 IMPLANT
SPIKE FLUID TRANSFER (MISCELLANEOUS) ×2 IMPLANT
STAPLE RELOAD 45 GRN (STAPLE) ×2 IMPLANT
SUT ETHILON 3 0 PS 1 (SUTURE) ×2 IMPLANT
SUT MNCRL 3 0 RB1 (SUTURE) ×2 IMPLANT
SUT MNCRL 3 0 VIOLET RB1 (SUTURE) ×2 IMPLANT
SUT MNCRL AB 4-0 PS2 18 (SUTURE) ×4 IMPLANT
SUT PDS PLUS AB 0 CT-2 (SUTURE) ×4 IMPLANT
SUT VIC AB 0 CT1 27XBRD ANTBC (SUTURE) ×4 IMPLANT
SUT VIC AB 2-0 SH 27X BRD (SUTURE) ×2 IMPLANT
SUT VIC AB 3-0 SH 27X BRD (SUTURE) IMPLANT
SYR 27GX1/2 1ML LL SAFETY (SYRINGE) ×2 IMPLANT
TROCAR Z THREAD OPTICAL 12X100 (TROCAR) IMPLANT
WATER STERILE IRR 1000ML POUR (IV SOLUTION) ×2 IMPLANT

## 2024-01-21 NOTE — Anesthesia Procedure Notes (Signed)
 Procedure Name: Intubation Date/Time: 01/21/2024 2:35 PM  Performed by: Kizzie Fantasia, CRNAPre-anesthesia Checklist: Patient identified, Emergency Drugs available, Suction available, Patient being monitored and Timeout performed Patient Re-evaluated:Patient Re-evaluated prior to induction Oxygen Delivery Method: Circle system utilized Preoxygenation: Pre-oxygenation with 100% oxygen Induction Type: IV induction Ventilation: Mask ventilation without difficulty Laryngoscope Size: Mac and 4 Grade View: Grade II Tube type: Oral Tube size: 7.5 mm Number of attempts: 1 Airway Equipment and Method: Stylet Placement Confirmation: ETT inserted through vocal cords under direct vision, positive ETCO2 and breath sounds checked- equal and bilateral Secured at: 23 cm Tube secured with: Tape Dental Injury: Teeth and Oropharynx as per pre-operative assessment

## 2024-01-21 NOTE — Op Note (Signed)
 Preoperative diagnosis: Clinically localized adenocarcinoma of the prostate (clinical stage T1c N0 Mx)  Postoperative diagnosis: Clinically localized adenocarcinoma of the prostate (clinical stage T1c N0 Mx)  Procedure:  Robotic assisted laparoscopic radical prostatectomy (bilateral nerve sparing) Bilateral robotic assisted laparoscopic pelvic lymphadenectomy  Surgeon: Moody Bruins. M.D.  Assistant: Harrie Foreman, PA-C  An assistant was required for this surgical procedure.  The duties of the assistant included but were not limited to suctioning, passing suture, camera manipulation, retraction. This procedure would not be able to be performed without an Geophysicist/field seismologist.  Anesthesia: General  Complications: None  EBL: 100 mL  IVF:  1000 mL crystalloid  Specimens: Prostate and seminal vesicles Right pelvic lymph nodes Left pelvic lymph nodes  Disposition of specimens: Pathology  Drains: 20 Fr coude catheter # 19 Blake pelvic drain  Indication: Cameron Nguyen is a 58 y.o. year old patient with clinically localized prostate cancer.  After a thorough review of the management options for treatment of prostate cancer, he elected to proceed with surgical therapy and the above procedure(s).  We have discussed the potential benefits and risks of the procedure, side effects of the proposed treatment, the likelihood of the patient achieving the goals of the procedure, and any potential problems that might occur during the procedure or recuperation. Informed consent has been obtained.  Description of procedure:  The patient was taken to the operating room and a general anesthetic was administered. He was given preoperative antibiotics, placed in the dorsal lithotomy position, and prepped and draped in the usual sterile fashion. Next a preoperative timeout was performed. A urethral catheter was placed into the bladder and a site was selected near the umbilicus for placement of the camera  port. This was placed using a standard open Hassan technique which allowed entry into the peritoneal cavity under direct vision and without difficulty. An 8 mm robotic port was placed and a pneumoperitoneum established. The camera was then used to inspect the abdomen and there was no evidence of any intra-abdominal injuries or other abnormalities. The remaining abdominal ports were then placed. 8 mm robotic ports were placed in the right lower quadrant, left lower quadrant, and far left lateral abdominal wall. A 5 mm port was placed in the right upper quadrant and a 12 mm port was placed in the right lateral abdominal wall for laparoscopic assistance. All ports were placed under direct vision without difficulty. The surgical cart was then docked.   Utilizing the cautery scissors, the bladder was reflected posteriorly allowing entry into the space of Retzius and identification of the endopelvic fascia and prostate. The periprostatic fat was then removed from the prostate allowing full exposure of the endopelvic fascia. The endopelvic fascia was then incised from the apex back to the base of the prostate bilaterally and the underlying levator muscle fibers were swept laterally off the prostate thereby isolating the dorsal venous complex. The dorsal vein was then stapled and divided with a 45 mm Flex Echelon stapler. Attention then turned to the bladder neck which was divided anteriorly thereby allowing entry into the bladder and exposure of the urethral catheter. The catheter balloon was deflated and the catheter was brought into the operative field and used to retract the prostate anteriorly. The posterior bladder neck was then examined and was divided allowing further dissection between the bladder and prostate posteriorly until the vasa deferentia and seminal vessels were identified. The vasa deferentia were isolated, divided, and lifted anteriorly. The seminal vesicles were dissected down to  their tips with care  to control the seminal vascular arterial blood supply. These structures were then lifted anteriorly and the space between Denonvillier's fascia and the anterior rectum was developed with a combination of sharp and blunt dissection. This isolated the vascular pedicles of the prostate.  The lateral prostatic fascia was then sharply incised allowing release of the neurovascular bundles bilaterally. The vascular pedicles of the prostate were then ligated with Weck clips between the prostate and neurovascular bundles and divided with sharp cold scissor dissection resulting in neurovascular bundle preservation. The neurovascular bundles were then separated off the apex of the prostate and urethra bilaterally.  The urethra was then sharply transected allowing the prostate specimen to be disarticulated. The pelvis was copiously irrigated and hemostasis was ensured. There was no evidence for rectal injury.  Attention then turned to the right pelvic sidewall. The fibrofatty tissue between the external iliac vein, confluence of the iliac vessels, hypogastric artery, and Cooper's ligament was dissected free from the pelvic sidewall with care to preserve the obturator nerve. Weck clips were used for lymphostasis and hemostasis. An identical procedure was performed on the contralateral side and the lymphatic packets were removed for permanent pathologic analysis.  Attention then turned to the urethral anastomosis. A 2-0 Vicryl slip knot was placed between Denonvillier's fascia, the posterior bladder neck, and the posterior urethra to reapproximate these structures. A double-armed 3-0 Monocryl suture was then used to perform a 360 running tension-free anastomosis between the bladder neck and urethra. A new urethral catheter was then placed into the bladder and irrigated. There were no blood clots within the bladder and the anastomosis appeared to be watertight. A #19 Blake drain was then brought through the left lateral 8  mm port site and positioned appropriately within the pelvis. It was secured to the skin with a nylon suture. The surgical cart was then undocked. The right lateral 12 mm port site was closed at the fascial level with a 0 Vicryl suture placed laparoscopically. All remaining ports were then removed under direct vision. The prostate specimen was removed intact within the Endopouch retrieval bag via the periumbilical camera port site. This fascial opening was closed with two running 0 PDS sutures. 0.25% Marcaine was then injected into all port sites and all incisions were reapproximated at the skin level with 4-0 Monocryl subcuticular sutures and Dermabond. The patient appeared to tolerate the procedure well and without complications. The patient was able to be extubated and transferred to the recovery unit in satisfactory condition.   Moody Bruins MD

## 2024-01-21 NOTE — Discharge Instructions (Signed)

## 2024-01-21 NOTE — Anesthesia Preprocedure Evaluation (Addendum)
 Anesthesia Evaluation  Patient identified by MRN, date of birth, ID band Patient awake    Reviewed: Allergy & Precautions, NPO status , Patient's Chart, lab work & pertinent test results  Airway Mallampati: II  TM Distance: >3 FB Neck ROM: Full    Dental  (+) Teeth Intact, Dental Advisory Given   Pulmonary neg pulmonary ROS   breath sounds clear to auscultation       Cardiovascular negative cardio ROS  Rhythm:Regular Rate:Normal     Neuro/Psych negative neurological ROS  negative psych ROS   GI/Hepatic negative GI ROS, Neg liver ROS,,,  Endo/Other  negative endocrine ROS    Renal/GU negative Renal ROS     Musculoskeletal  (+) Arthritis ,    Abdominal   Peds  Hematology negative hematology ROS (+)   Anesthesia Other Findings   Reproductive/Obstetrics                             Anesthesia Physical Anesthesia Plan  ASA: 2  Anesthesia Plan: General   Post-op Pain Management: Tylenol PO (pre-op)* and Toradol IV (intra-op)*   Induction: Intravenous  PONV Risk Score and Plan: 3 and Ondansetron, Dexamethasone and Midazolam  Airway Management Planned: Oral ETT  Additional Equipment: None  Intra-op Plan:   Post-operative Plan: Extubation in OR  Informed Consent: I have reviewed the patients History and Physical, chart, labs and discussed the procedure including the risks, benefits and alternatives for the proposed anesthesia with the patient or authorized representative who has indicated his/her understanding and acceptance.     Dental advisory given  Plan Discussed with: CRNA  Anesthesia Plan Comments:        Anesthesia Quick Evaluation

## 2024-01-21 NOTE — Progress Notes (Signed)
 Placed and released STAT Hemoglobin and Hematocrit post op order for the floor.

## 2024-01-21 NOTE — Anesthesia Postprocedure Evaluation (Signed)
 Anesthesia Post Note  Patient: RICKIE GANGE  Procedure(s) Performed: XI ROBOTIC ASSISTED LAPAROSCOPIC RADICAL PROSTATECTOMY LEVEL 2 BILATERAL PELVIC LYMPHADENECTOMY (Bilateral)     Patient location during evaluation: PACU Anesthesia Type: General Level of consciousness: awake and alert Pain management: pain level controlled Vital Signs Assessment: post-procedure vital signs reviewed and stable Respiratory status: spontaneous breathing, nonlabored ventilation, respiratory function stable and patient connected to nasal cannula oxygen Cardiovascular status: blood pressure returned to baseline and stable Postop Assessment: no apparent nausea or vomiting Anesthetic complications: no  No notable events documented.  Last Vitals:  Vitals:   01/21/24 1800 01/21/24 1825  BP: (!) 140/77 (!) 147/81  Pulse: 81 78  Resp: 19   Temp:  36.7 C  SpO2: 95% 98%    Last Pain:  Vitals:   01/21/24 1825  TempSrc: Oral  PainSc:                  Trevor Iha

## 2024-01-21 NOTE — Transfer of Care (Signed)
 Immediate Anesthesia Transfer of Care Note  Patient: ANASTACIO BUA  Procedure(s) Performed: Procedure(s) with comments: XI ROBOTIC ASSISTED LAPAROSCOPIC RADICAL PROSTATECTOMY LEVEL 2 (N/A) - 210 MINUTES NEEDED FOR CASE BILATERAL PELVIC LYMPHADENECTOMY (Bilateral)  Patient Location: PACU  Anesthesia Type:General  Level of Consciousness:  sedated, patient cooperative and responds to stimulation  Airway & Oxygen Therapy:Patient Spontanous Breathing and Patient connected to face mask oxgen  Post-op Assessment:  Report given to PACU RN and Post -op Vital signs reviewed and stable  Post vital signs:  Reviewed and stable  Last Vitals:  Vitals:   01/21/24 1047  BP: 134/73  Pulse: 70  Resp: 17  Temp: 36.8 C  SpO2: 99%    Complications: No apparent anesthesia complications

## 2024-01-21 NOTE — Interval H&P Note (Signed)
 History and Physical Interval Note:  01/21/2024 11:02 AM  Cameron Nguyen  has presented today for surgery, with the diagnosis of PROSTATE CANCER.  The various methods of treatment have been discussed with the patient and family. After consideration of risks, benefits and other options for treatment, the patient has consented to  Procedure(s) with comments: XI ROBOTIC ASSISTED LAPAROSCOPIC RADICAL PROSTATECTOMY LEVEL 2 (N/A) - 210 MINUTES NEEDED FOR CASE BILATERAL PELVIC LYMPHADENECTOMY (Bilateral) as a surgical intervention.  The patient's history has been reviewed, patient examined, no change in status, stable for surgery.  I have reviewed the patient's chart and labs.  Questions were answered to the patient's satisfaction.     [employer]

## 2024-01-22 ENCOUNTER — Encounter (HOSPITAL_COMMUNITY): Payer: Self-pay | Admitting: Urology

## 2024-01-22 DIAGNOSIS — C61 Malignant neoplasm of prostate: Secondary | ICD-10-CM | POA: Diagnosis not present

## 2024-01-22 LAB — HEMOGLOBIN AND HEMATOCRIT, BLOOD
HCT: 44.8 % (ref 39.0–52.0)
Hemoglobin: 14.8 g/dL (ref 13.0–17.0)

## 2024-01-22 MED ORDER — BISACODYL 10 MG RE SUPP
10.0000 mg | Freq: Once | RECTAL | Status: AC
  Administered 2024-01-22: 10 mg via RECTAL
  Filled 2024-01-22: qty 1

## 2024-01-22 MED ORDER — TRAMADOL HCL 50 MG PO TABS: 50.0000 mg | ORAL_TABLET | Freq: Four times a day (QID) | ORAL | Status: DC | PRN

## 2024-01-22 NOTE — Discharge Summary (Signed)
  Date of admission: 01/21/2024  Date of discharge: 01/22/2024  Admission diagnosis: Prostate Cancer  Discharge diagnosis: Prostate Cancer  History and Physical: For full details, please see admission history and physical. Briefly, Cameron Nguyen is a 58 y.o. gentleman with localized prostate cancer.  After discussing management/treatment options, he elected to proceed with surgical treatment.  Hospital Course: Cameron Nguyen was taken to the operating room on 01/21/2024 and underwent a robotic assisted laparoscopic radical prostatectomy. He tolerated this procedure well and without complications. Postoperatively, he was able to be transferred to a regular hospital room following recovery from anesthesia.  He was able to begin ambulating the night of surgery. He remained hemodynamically stable overnight.  He had excellent urine output with appropriately minimal output from his pelvic drain and his pelvic drain was removed on POD #1.  He was transitioned to oral pain medication, tolerated a clear liquid diet, and had met all discharge criteria and was able to be discharged home later on POD#1.  Laboratory values:  Recent Labs    01/21/24 1720 01/21/24 1859 01/22/24 0511  HGB 4.2* 15.2 14.8  HCT 13.7* 43.6 44.8    Disposition: Home  Discharge instruction: He was instructed to be ambulatory but to refrain from heavy lifting, strenuous activity, or driving. He was instructed on urethral catheter care.  Discharge medications:   Allergies as of 01/22/2024   No Known Allergies      Medication List     TAKE these medications    docusate sodium 100 MG capsule Commonly known as: COLACE Take 1 capsule (100 mg total) by mouth 2 (two) times daily.   sulfamethoxazole-trimethoprim 800-160 MG tablet Commonly known as: BACTRIM DS Take 1 tablet by mouth 2 (two) times daily. Start the day prior to foley removal appointment   traMADol 50 MG tablet Commonly known as: Ultram Take 1-2 tablets (50-100  mg total) by mouth every 6 (six) hours as needed for moderate pain (pain score 4-6) or severe pain (pain score 7-10).        Followup: He will followup in 1 week for catheter removal and to discuss his surgical pathology results.

## 2024-01-22 NOTE — Progress Notes (Signed)
 Patient ID: Cameron Nguyen, male   DOB: [birth year], 58 y.o.   MRN: [id number]  1 Day Post-Op Subjective: The patient is doing well.  No nausea or vomiting. Pain is adequately controlled.  Objective: Vital signs in last 24 hours: Temp:  [98.1 F (36.7 C)-99.1 F (37.3 C)] 98.6 F (37 C) (04/04 0607) Pulse Rate:  [68-99] 68 (04/04 0607) Resp:  [15-23] 20 (04/04 0607) BP: (111-162)/(64-97) 118/71 (04/04 0607) SpO2:  [94 %-100 %] 97 % (04/04 0607) Weight:  [71.3 kg] 71.3 kg (04/03 1835)  Intake/Output from previous day: 04/03 0701 - 04/04 0700 In: 2182.2 [I.V.:2132.2; IV Piggyback:50] Out: 1985 [Urine:1875; Drains:10; Blood:100] Intake/Output this shift: No intake/output data recorded.  Physical Exam:  General: Alert and oriented. CV: RRR Lungs: Clear bilaterally. GI: Soft, Nondistended. Incisions: Clean, dry, and intact Urine: Clear Extremities: Nontender, no erythema, no edema.  Lab Results: Recent Labs    01/21/24 1720 01/21/24 1859 01/22/24 0511  HGB 4.2* 15.2 14.8  HCT 13.7* 43.6 44.8      Assessment/Plan: POD# 1 s/p robotic prostatectomy.  1) SL IVF 2) Ambulate, Incentive spirometry 3) Transition to oral pain medication 4) Dulcolax suppository 5) D/C pelvic drain 6) Plan for likely discharge later today   Cameron Nguyen. MD   LOS: 0 days   Cameron Nguyen 01/22/2024, 7:54 AM

## 2024-01-22 NOTE — Progress Notes (Signed)
   01/22/24 0840  TOC Brief Assessment  Insurance and Status Reviewed  Patient has primary care physician Yes  Home environment has been reviewed Resides in single family home with spouse  Prior level of function: Independent with ADLs at baseline  Prior/Current Home Services No current home services  Social Drivers of Health Review SDOH reviewed no interventions necessary  Readmission risk has been reviewed Yes  Transition of care needs no transition of care needs at this time

## 2024-01-22 NOTE — Progress Notes (Signed)
 Patient discharging home with wife. Foley and teaching done - wife able to demonstrate with no difficulties. Catheter irrigated prior to changing bags, irrigated smoothly with no clots. AVS reviewed and all questions answered satisfactorily. Patient in NAD at time of discharge.

## 2024-01-26 LAB — SURGICAL PATHOLOGY

## 2024-01-29 DIAGNOSIS — R338 Other retention of urine: Secondary | ICD-10-CM | POA: Diagnosis not present

## 2024-02-03 DIAGNOSIS — R338 Other retention of urine: Secondary | ICD-10-CM | POA: Diagnosis not present

## 2024-02-10 DIAGNOSIS — N390 Urinary tract infection, site not specified: Secondary | ICD-10-CM | POA: Diagnosis not present

## 2024-02-10 DIAGNOSIS — R3914 Feeling of incomplete bladder emptying: Secondary | ICD-10-CM | POA: Diagnosis not present

## 2024-02-10 DIAGNOSIS — B9689 Other specified bacterial agents as the cause of diseases classified elsewhere: Secondary | ICD-10-CM | POA: Diagnosis not present

## 2024-02-22 DIAGNOSIS — N393 Stress incontinence (female) (male): Secondary | ICD-10-CM | POA: Diagnosis not present

## 2024-02-22 DIAGNOSIS — M62838 Other muscle spasm: Secondary | ICD-10-CM | POA: Diagnosis not present

## 2024-02-22 DIAGNOSIS — M6281 Muscle weakness (generalized): Secondary | ICD-10-CM | POA: Diagnosis not present

## 2024-04-11 DIAGNOSIS — M62838 Other muscle spasm: Secondary | ICD-10-CM | POA: Diagnosis not present

## 2024-04-11 DIAGNOSIS — M6281 Muscle weakness (generalized): Secondary | ICD-10-CM | POA: Diagnosis not present

## 2024-04-11 DIAGNOSIS — N393 Stress incontinence (female) (male): Secondary | ICD-10-CM | POA: Diagnosis not present

## 2024-04-11 DIAGNOSIS — C61 Malignant neoplasm of prostate: Secondary | ICD-10-CM | POA: Diagnosis not present

## 2024-04-27 DIAGNOSIS — C61 Malignant neoplasm of prostate: Secondary | ICD-10-CM | POA: Diagnosis not present

## 2024-04-27 DIAGNOSIS — N5201 Erectile dysfunction due to arterial insufficiency: Secondary | ICD-10-CM | POA: Diagnosis not present

## 2024-04-27 DIAGNOSIS — N393 Stress incontinence (female) (male): Secondary | ICD-10-CM | POA: Diagnosis not present

## 2024-07-15 DIAGNOSIS — J069 Acute upper respiratory infection, unspecified: Secondary | ICD-10-CM | POA: Diagnosis not present

## 2024-07-15 DIAGNOSIS — K4090 Unilateral inguinal hernia, without obstruction or gangrene, not specified as recurrent: Secondary | ICD-10-CM | POA: Diagnosis not present

## 2024-07-15 DIAGNOSIS — Z8546 Personal history of malignant neoplasm of prostate: Secondary | ICD-10-CM | POA: Diagnosis not present

## 2024-07-15 DIAGNOSIS — Z23 Encounter for immunization: Secondary | ICD-10-CM | POA: Diagnosis not present

## 2024-07-29 DIAGNOSIS — K4020 Bilateral inguinal hernia, without obstruction or gangrene, not specified as recurrent: Secondary | ICD-10-CM | POA: Diagnosis not present

## 2024-08-30 ENCOUNTER — Ambulatory Visit: Payer: Self-pay | Admitting: Surgery

## 2024-09-11 NOTE — Progress Notes (Signed)
 COVID Vaccine received:  []  No [x]  Yes Date of any COVID positive Test in last 39 days:none  PCP - Alm Rav MD [phone]  Cardiologist - none  Chest x-ray -  EKG -  no history to warrant Stress Test -  ECHO -  Cardiac Cath -  CT Coronary Calcium score: 0 on 12-03-2022  Epic  Bowel Prep - [x]  No  []   Yes ______  Pacemaker / ICD device [x]  No []  Yes   Spinal Cord Stimulator:[x]  No []  Yes       History of Sleep Apnea? [x]  No []  Yes   CPAP used?- [x]  No []  Yes    Patient has: [x]  NO Hx DM   []  Pre-DM   []  DM1  []   DM2 Does the patient monitor blood sugar?   [x]  N/A   []  No []  Yes   Blood Thinner / Instructions:  none Aspirin Instructions:  ASA 81 mg sometimes takes, will hold  Activity level: Able to walk up 2 flights of stairs without becoming significantly short of breath or having chest pain?  []  No   [x]    Yes  Patient can perform ADLs without assistance. []  No   [x]   Yes  Anesthesia review: s/p prostatectomy/ Lymphadenectomy 01-21-2024, HLD, no other medical or surgical history   Patient denies any S&S of respiratory illness or Covid - no shortness of breath, fever, cough or chest pain at PAT appointment.  Patient verbalized understanding and agreement to the Pre-Surgical Instructions that were given to them at this PAT appointment. Patient was also educated of the need to review these PAT instructions again prior to his/her surgery.I reviewed the appropriate phone numbers to call if they have any and questions or concerns.

## 2024-09-11 NOTE — Patient Instructions (Signed)
 SURGICAL WAITING ROOM VISITATION Patients having surgery or a procedure may have no more than 2 support people in the waiting area - these visitors may rotate in the visitor waiting room.   If the patient needs to stay at the hospital during part of their recovery, the visitor guidelines for inpatient rooms apply.  PRE-OP VISITATION  Pre-op nurse will coordinate an appropriate time for 1 support person to accompany the patient in pre-op.  This support person may not rotate.  This visitor will be contacted when the time is appropriate for the visitor to come back in the pre-op area.  Please refer to the [facility] website for the visitor guidelines for Inpatients (after your surgery is over and you are in a regular room).  You are not required to quarantine at this time prior to your surgery. However, you must do this: Hand Hygiene often Do NOT share personal items Notify your provider if you are in close contact with someone who has COVID or you develop fever 100.4 or greater, new onset of sneezing, cough, sore throat, shortness of breath or body aches.  If you test positive for Covid or have been in contact with anyone that has tested positive in the last 10 days please notify you surgeon.    Your procedure is scheduled on:  Friday  September 23, 2024  Report to [facility] Main Entrance: [address] entrance where the [employer] is available.   Report to admitting at: 06:45    AM  Call this number if you have any questions or problems the morning of surgery [phone]  FOLLOW ANY ADDITIONAL PRE OP INSTRUCTIONS YOU RECEIVED FROM YOUR SURGEON'S OFFICE!!!  Do not eat food after Midnight the night prior to your surgery/procedure.  After Midnight you may have the following liquids until  06:00 AM DAY OF SURGERY  Clear Liquid Diet Water  Black Coffee (sugar ok, NO MILK/CREAM OR CREAMERS)  Tea (sugar ok, NO MILK/CREAM OR CREAMERS) regular and decaf                              Plain Jell-O  with no fruit (NO RED)                                           Fruit ices (not with fruit pulp, NO RED)                                     Popsicles (NO RED)                                                                  Juice: NO CITRUS JUICES: only apple, WHITE grape, WHITE cranberry Sports drinks like Gatorade or Powerade (NO RED)              Oral Hygiene is also important to reduce your risk of infection.        Remember - BRUSH YOUR TEETH THE MORNING OF SURGERY WITH YOUR REGULAR TOOTHPASTE  Do NOT smoke after Midnight  the night before surgery.  STOP TAKING all Vitamins, Herbs and supplements 1 week before your surgery.   Take ONLY these medicines the morning of surgery with A SIP OF WATER : none                 You may not have any metal on your body including  jewelry, and body piercing  Do not wear lotions, powders, cologne, or deodorant  Men may shave face and neck.  Contacts, Hearing Aids, dentures or bridgework may not be worn into surgery. DENTURES WILL BE REMOVED PRIOR TO SURGERY PLEASE DO NOT APPLY Poly grip OR ADHESIVES!!!  Patients discharged on the day of surgery will not be allowed to drive home.  Someone NEEDS to stay with you for the first 24 hours after anesthesia.  Do not bring your home medications to the hospital. The Pharmacy will dispense medications listed on your medication list to you during your admission in the Hospital.  Special Instructions: Bring a copy of your healthcare power of attorney and living will documents the day of surgery, if you wish to have them scanned into your [place] [facility] Records- EPIC  Please read over the following fact sheets you were given: IF YOU HAVE QUESTIONS ABOUT YOUR PRE-OP INSTRUCTIONS, PLEASE CALL [phone]   [facility] - Preparing for Surgery        Before surgery, you can play an important role.  Because skin is not sterile, your skin needs to be as free of germs as possible.  You can  reduce the number of germs on your skin by washing with CHG (chlorahexidine gluconate) soap before surgery.  CHG is an antiseptic cleaner which kills germs and bonds with the skin to continue killing germs even after washing. Please DO NOT use if you have an allergy to CHG or antibacterial soaps.  If your skin becomes reddened/irritated stop using the CHG and inform your nurse when you arrive at Short Stay. Do not shave (including legs and underarms) for at least 48 hours prior to the first CHG shower.  You may shave your face/neck.  Please follow these instructions carefully:  1.  Shower with CHG Soap the night before surgery ONLY (DO NOT USE THE CHG SOAP THE MORNING OF SURGERY).  2.  If you choose to wash your hair, wash your hair first as usual with your normal  shampoo.  3.  After you shampoo, rinse your hair and body thoroughly to remove the shampoo.                             4.  Use CHG as you would any other liquid soap.  You can apply chg directly to the skin and wash.  Gently with a scrungie or clean washcloth.  5.  Apply the CHG Soap to your body ONLY FROM THE NECK DOWN.   Do not use on face/ open                           Wound or open sores. Avoid contact with eyes, ears mouth and genitals (private parts).                       Wash face,  Genitals (private parts) with your normal soap.             6.  Wash thoroughly, paying special attention to the area  where your  surgery  will be performed.  7.  Thoroughly rinse your body with warm water  from the neck down.  8.  DO NOT shower/wash with your normal soap after using and rinsing off the CHG Soap.                9.  Pat yourself dry with a clean towel.            10.  Wear clean pajamas.            11.  Place clean sheets on your bed the night of your first shower and do not  sleep with pets.  Day of Surgery : Do not apply any CHG, lotions/deodorants the morning of surgery.  Please wear clean clothes to the hospital/surgery  center.   FAILURE TO FOLLOW THESE INSTRUCTIONS MAY RESULT IN THE CANCELLATION OF YOUR SURGERY  PATIENT SIGNATURE_________________________________  NURSE SIGNATURE__________________________________  ________________________________________________________________________

## 2024-09-12 ENCOUNTER — Other Ambulatory Visit: Payer: Self-pay

## 2024-09-12 ENCOUNTER — Encounter (HOSPITAL_COMMUNITY): Payer: Self-pay

## 2024-09-12 ENCOUNTER — Encounter (HOSPITAL_COMMUNITY)
Admission: RE | Admit: 2024-09-12 | Discharge: 2024-09-12 | Disposition: A | Discharge status: Home / Self Care | Source: Ambulatory Visit | Attending: Surgery | Admitting: Surgery

## 2024-09-12 VITALS — BP 129/75 | HR 62 | Temp 98.4°F | Resp 16 | Ht 72.0 in | Wt 160.0 lb

## 2024-09-12 DIAGNOSIS — C61 Malignant neoplasm of prostate: Secondary | ICD-10-CM | POA: Insufficient documentation

## 2024-09-12 DIAGNOSIS — Z01812 Encounter for preprocedural laboratory examination: Secondary | ICD-10-CM | POA: Diagnosis not present

## 2024-09-12 DIAGNOSIS — Z01818 Encounter for other preprocedural examination: Secondary | ICD-10-CM

## 2024-09-12 LAB — CBC
HCT: 48.6 % (ref 39.0–52.0)
Hemoglobin: 15.9 g/dL (ref 13.0–17.0)
MCH: 30.6 pg (ref 26.0–34.0)
MCHC: 32.7 g/dL (ref 30.0–36.0)
MCV: 93.6 fL (ref 80.0–100.0)
Platelets: 270 K/uL (ref 150–400)
RBC: 5.19 MIL/uL (ref 4.22–5.81)
RDW: 12.3 % (ref 11.5–15.5)
WBC: 5.3 K/uL (ref 4.0–10.5)
nRBC: 0 % (ref 0.0–0.2)

## 2024-09-12 LAB — BASIC METABOLIC PANEL WITH GFR
Anion gap: 8 (ref 5–15)
BUN: 9 mg/dL (ref 6–20)
CO2: 28 mmol/L (ref 22–32)
Calcium: 9 mg/dL (ref 8.9–10.3)
Chloride: 104 mmol/L (ref 98–111)
Creatinine, Ser: 0.83 mg/dL (ref 0.61–1.24)
GFR, Estimated: >60 mL/min (ref >60)
Glucose, Bld: 101 mg/dL — ABNORMAL HIGH (ref 70–99)
Potassium: 4.2 mmol/L (ref 3.5–5.1)
Sodium: 141 mmol/L (ref 135–145)

## 2024-09-23 ENCOUNTER — Ambulatory Visit (HOSPITAL_COMMUNITY)
Admission: RE | Admit: 2024-09-23 | Discharge: 2024-09-23 | Disposition: A | Discharge status: Home / Self Care | Source: Ambulatory Visit | Attending: Surgery | Admitting: Surgery

## 2024-09-23 ENCOUNTER — Ambulatory Visit (HOSPITAL_COMMUNITY): Admitting: Anesthesiology

## 2024-09-23 ENCOUNTER — Encounter (HOSPITAL_COMMUNITY): Payer: Self-pay | Admitting: Surgery

## 2024-09-23 ENCOUNTER — Ambulatory Visit (HOSPITAL_COMMUNITY): Payer: Self-pay | Admitting: Physician Assistant

## 2024-09-23 ENCOUNTER — Other Ambulatory Visit: Payer: Self-pay

## 2024-09-23 ENCOUNTER — Encounter (HOSPITAL_COMMUNITY)
Admission: RE | Disposition: A | Discharge status: Home / Self Care | Payer: Self-pay | Source: Ambulatory Visit | Attending: Surgery

## 2024-09-23 DIAGNOSIS — Z87891 Personal history of nicotine dependence: Secondary | ICD-10-CM | POA: Diagnosis not present

## 2024-09-23 DIAGNOSIS — M1990 Unspecified osteoarthritis, unspecified site: Secondary | ICD-10-CM | POA: Diagnosis not present

## 2024-09-23 DIAGNOSIS — K4020 Bilateral inguinal hernia, without obstruction or gangrene, not specified as recurrent: Secondary | ICD-10-CM | POA: Diagnosis not present

## 2024-09-23 HISTORY — PX: INGUINAL HERNIA REPAIR: SHX194

## 2024-09-23 SURGERY — REPAIR, HERNIA, INGUINAL, BILATERAL, LAPAROSCOPIC
Anesthesia: General | Laterality: Bilateral

## 2024-09-23 MED ORDER — EPHEDRINE 5 MG/ML INJ
INTRAVENOUS | Status: AC
  Filled 2024-09-23: qty 5

## 2024-09-23 MED ORDER — PHENYLEPHRINE 80 MCG/ML (10ML) SYRINGE FOR IV PUSH (FOR BLOOD PRESSURE SUPPORT)
PREFILLED_SYRINGE | INTRAVENOUS | Status: DC | PRN
  Administered 2024-09-23 (×2): 80 ug via INTRAVENOUS

## 2024-09-23 MED ORDER — SUGAMMADEX SODIUM 200 MG/2ML IV SOLN
INTRAVENOUS | Status: DC | PRN
  Administered 2024-09-23: 200 mg via INTRAVENOUS

## 2024-09-23 MED ORDER — PHENYLEPHRINE 80 MCG/ML (10ML) SYRINGE FOR IV PUSH (FOR BLOOD PRESSURE SUPPORT)
PREFILLED_SYRINGE | INTRAVENOUS | Status: AC
  Filled 2024-09-23: qty 10

## 2024-09-23 MED ORDER — LIDOCAINE HCL (PF) 2 % IJ SOLN
INTRAMUSCULAR | Status: AC
  Filled 2024-09-23: qty 5

## 2024-09-23 MED ORDER — EPHEDRINE SULFATE (PRESSORS) 25 MG/5ML IV SOSY
PREFILLED_SYRINGE | INTRAVENOUS | Status: DC | PRN
  Administered 2024-09-23: 10 mg via INTRAVENOUS

## 2024-09-23 MED ORDER — CEFAZOLIN SODIUM-DEXTROSE 2-4 GM/100ML-% IV SOLN
2.0000 g | INTRAVENOUS | Status: AC
  Administered 2024-09-23: 2 g via INTRAVENOUS
  Filled 2024-09-23: qty 100

## 2024-09-23 MED ORDER — CHLORHEXIDINE GLUCONATE 0.12 % MT SOLN
15.0000 mL | Freq: Once | OROMUCOSAL | Status: AC
  Administered 2024-09-23: 15 mL via OROMUCOSAL

## 2024-09-23 MED ORDER — OXYCODONE HCL 5 MG PO TABS: 5.0000 mg | ORAL_TABLET | Freq: Once | ORAL | Status: DC | PRN

## 2024-09-23 MED ORDER — FENTANYL CITRATE (PF) 100 MCG/2ML IJ SOLN
INTRAMUSCULAR | Status: DC | PRN
  Administered 2024-09-23 (×2): 50 ug via INTRAVENOUS

## 2024-09-23 MED ORDER — FENTANYL CITRATE (PF) 50 MCG/ML IJ SOSY: 25.0000 ug | PREFILLED_SYRINGE | INTRAMUSCULAR | Status: DC | PRN

## 2024-09-23 MED ORDER — ONDANSETRON HCL 4 MG/2ML IJ SOLN
INTRAMUSCULAR | Status: AC
  Filled 2024-09-23: qty 2

## 2024-09-23 MED ORDER — OXYCODONE-ACETAMINOPHEN 5-325 MG PO TABS: 1.0000 | ORAL_TABLET | ORAL | 0 refills | Status: AC | PRN

## 2024-09-23 MED ORDER — ACETAMINOPHEN 500 MG PO TABS
1000.0000 mg | ORAL_TABLET | Freq: Once | ORAL | Status: DC
  Filled 2024-09-23: qty 2

## 2024-09-23 MED ORDER — MIDAZOLAM HCL 2 MG/2ML IJ SOLN
INTRAMUSCULAR | Status: AC
  Filled 2024-09-23: qty 2

## 2024-09-23 MED ORDER — LIDOCAINE HCL (CARDIAC) PF 100 MG/5ML IV SOSY
PREFILLED_SYRINGE | INTRAVENOUS | Status: DC | PRN
  Administered 2024-09-23: 80 mg via INTRAVENOUS

## 2024-09-23 MED ORDER — 0.9 % SODIUM CHLORIDE (POUR BTL) OPTIME
TOPICAL | Status: DC | PRN
  Administered 2024-09-23: 1000 mL

## 2024-09-23 MED ORDER — MIDAZOLAM HCL 5 MG/5ML IJ SOLN
INTRAMUSCULAR | Status: DC | PRN
  Administered 2024-09-23: 2 mg via INTRAVENOUS

## 2024-09-23 MED ORDER — CHLORHEXIDINE GLUCONATE CLOTH 2 % EX PADS: 6.0000 | MEDICATED_PAD | Freq: Once | CUTANEOUS | Status: DC

## 2024-09-23 MED ORDER — GABAPENTIN 300 MG PO CAPS
300.0000 mg | ORAL_CAPSULE | ORAL | Status: AC
  Administered 2024-09-23: 300 mg via ORAL
  Filled 2024-09-23: qty 1

## 2024-09-23 MED ORDER — ORAL CARE MOUTH RINSE: 15.0000 mL | Freq: Once | OROMUCOSAL | Status: AC

## 2024-09-23 MED ORDER — PHENYLEPHRINE HCL-NACL 20-0.9 MG/250ML-% IV SOLN
INTRAVENOUS | Status: DC
  Filled 2024-09-23: qty 250

## 2024-09-23 MED ORDER — ONDANSETRON HCL 4 MG/2ML IJ SOLN
INTRAMUSCULAR | Status: DC | PRN
  Administered 2024-09-23: 4 mg via INTRAVENOUS

## 2024-09-23 MED ORDER — STERILE WATER FOR IRRIGATION IR SOLN
Status: DC | PRN
  Administered 2024-09-23: 1000 mL

## 2024-09-23 MED ORDER — LACTATED RINGERS IV SOLN: INTRAVENOUS | Status: DC

## 2024-09-23 MED ORDER — OXYCODONE HCL 5 MG/5ML PO SOLN: 5.0000 mg | Freq: Once | ORAL | Status: DC | PRN

## 2024-09-23 MED ORDER — PROPOFOL 10 MG/ML IV BOLUS
INTRAVENOUS | Status: AC
  Filled 2024-09-23: qty 20

## 2024-09-23 MED ORDER — BUPIVACAINE-EPINEPHRINE (PF) 0.25% -1:200000 IJ SOLN
INTRAMUSCULAR | Status: DC | PRN
  Administered 2024-09-23: 30 mL via PERINEURAL

## 2024-09-23 MED ORDER — BUPIVACAINE-EPINEPHRINE (PF) 0.25% -1:200000 IJ SOLN
INTRAMUSCULAR | Status: AC
  Filled 2024-09-23: qty 30

## 2024-09-23 MED ORDER — HEPARIN SODIUM (PORCINE) 5000 UNIT/ML IJ SOLN
5000.0000 [IU] | Freq: Once | INTRAMUSCULAR | Status: AC
  Administered 2024-09-23: 5000 [IU] via SUBCUTANEOUS
  Filled 2024-09-23: qty 1

## 2024-09-23 MED ORDER — FENTANYL CITRATE (PF) 100 MCG/2ML IJ SOLN
INTRAMUSCULAR | Status: AC
  Filled 2024-09-23: qty 2

## 2024-09-23 MED ORDER — ROCURONIUM BROMIDE 10 MG/ML (PF) SYRINGE
PREFILLED_SYRINGE | INTRAVENOUS | Status: AC
  Filled 2024-09-23: qty 10

## 2024-09-23 MED ORDER — DEXAMETHASONE SOD PHOSPHATE PF 10 MG/ML IJ SOLN
INTRAMUSCULAR | Status: DC | PRN
  Administered 2024-09-23: 4 mg via INTRAVENOUS

## 2024-09-23 MED ORDER — DROPERIDOL 2.5 MG/ML IJ SOLN: 0.6250 mg | Freq: Once | INTRAMUSCULAR | Status: DC | PRN

## 2024-09-23 MED ORDER — ACETAMINOPHEN 500 MG PO TABS
1000.0000 mg | ORAL_TABLET | ORAL | Status: AC
  Administered 2024-09-23: 1000 mg via ORAL

## 2024-09-23 MED ORDER — ROCURONIUM BROMIDE 100 MG/10ML IV SOLN
INTRAVENOUS | Status: DC | PRN
  Administered 2024-09-23: 20 mg via INTRAVENOUS
  Administered 2024-09-23: 50 mg via INTRAVENOUS

## 2024-09-23 MED ORDER — PROPOFOL 10 MG/ML IV BOLUS
INTRAVENOUS | Status: DC | PRN
  Administered 2024-09-23: 170 mg via INTRAVENOUS

## 2024-09-23 SURGICAL SUPPLY — 30 items
BAG COUNTER SPONGE SURGICOUNT (BAG) ×1 IMPLANT
CHLORAPREP W/TINT 26 (MISCELLANEOUS) ×1 IMPLANT
COVER SURGICAL LIGHT HANDLE (MISCELLANEOUS) ×1 IMPLANT
DERMABOND ADVANCED .7 DNX12 (GAUZE/BANDAGES/DRESSINGS) ×1 IMPLANT
ELECT REM PT RETURN 15FT ADLT (MISCELLANEOUS) ×1 IMPLANT
GLOVE BIO SURGEON STRL SZ7.5 (GLOVE) ×1 IMPLANT
GLOVE INDICATOR 8.0 STRL GRN (GLOVE) ×1 IMPLANT
GOWN STRL REUS W/ TWL XL LVL3 (GOWN DISPOSABLE) ×1 IMPLANT
GRASPER SUT TROCAR 14GX15 (MISCELLANEOUS) ×1 IMPLANT
IRRIGATION SUCT STRKRFLW 2 WTP (MISCELLANEOUS) IMPLANT
KIT BASIN OR (CUSTOM PROCEDURE TRAY) ×1 IMPLANT
KIT TURNOVER KIT A (KITS) ×1 IMPLANT
MARKER SKIN DUAL TIP RULER LAB (MISCELLANEOUS) ×1 IMPLANT
MESH 3DMAX 5X7 LT XLRG (Mesh General) IMPLANT
MESH 3DMAX 5X7 RT XLRG (Mesh General) IMPLANT
NDL INSUFFLATION 14GA 120MM (NEEDLE) ×1 IMPLANT
RELOAD STAPLE 4.0 BLU F/HERNIA (INSTRUMENTS) ×1 IMPLANT
RELOAD STAPLE 4.8 BLK F/HERNIA (STAPLE) IMPLANT
SCISSORS LAP 5X35 DISP (ENDOMECHANICALS) ×1 IMPLANT
SET TUBE SMOKE EVAC HIGH FLOW (TUBING) ×1 IMPLANT
SLEEVE Z-THREAD 5X100MM (TROCAR) ×1 IMPLANT
SPIKE FLUID TRANSFER (MISCELLANEOUS) IMPLANT
STAPLER HERNIA 12 8.5 360D (INSTRUMENTS) ×1 IMPLANT
SUT MNCRL AB 4-0 PS2 18 (SUTURE) ×1 IMPLANT
SUT VICRYL 0 UR6 27IN ABS (SUTURE) ×1 IMPLANT
TOWEL OR 17X26 10 PK STRL BLUE (TOWEL DISPOSABLE) ×1 IMPLANT
TRAY FOL W/BAG SLVR 16FR STRL (SET/KITS/TRAYS/PACK) IMPLANT
TRAY LAPAROSCOPIC (CUSTOM PROCEDURE TRAY) ×1 IMPLANT
TROCAR ADV FIXATION 12X100MM (TROCAR) ×1 IMPLANT
TROCAR Z-THREAD FIOS 5X100MM (TROCAR) ×1 IMPLANT

## 2024-09-23 NOTE — Discharge Instructions (Signed)

## 2024-09-23 NOTE — Op Note (Signed)
 Patient: Cameron Nguyen ([birth year], [id number])  Date of Surgery: 09/23/2024  Preoperative Diagnosis: BILATERAL INGUINAL HERNIA   Postoperative Diagnosis: BILATERAL INGUINAL HERNIA   Surgical Procedure: REPAIR, HERNIA, INGUINAL, BILATERAL, LAPAROSCOPIC: 50349 (CPT)    Operative Team Members:  Surgeons and Role:    * Chloe Baig, Deward PARAS, MD - Primary   Anesthesiologist: Boone Fess, MD CRNA: Nada Corean CROME, CRNA; Rudolpho Fonda SAUNDERS, CRNA   Anesthesia: General   Fluids:  Total I/O In: 100 [IV Piggyback:100] Out: 10 [Blood:10]  Complications: None  Drains:  None  Specimen: None  Disposition:  PACU - hemodynamically stable.  Plan of Care: Discharge to home after PACU  Indications for Procedure: Cameron Nguyen is an 58 y.o. male with bilateral inguinal hernias.  He has a history of prostate surgery.  I recommended laparoscopic, possibly open, bilateral inguinal hernia repair with mesh.  We discussed the procedure, its risks, benefits and alternatives and the patient granted consent to proceed.   Findings:  Technique: Transabdominal preperitoneal (TAPP) Hernia Location: Bilateral indirect inguinal hernia Mesh Size &Type:  Bard 3D max extra-large bilateral meshes Mesh Fixation: Endo-Universal hernia stapler  Infection status: Patient: Private Patient Elective Case Case: Elective Infection Present At Time Of Surgery (PATOS): None   Description of Procedure:  The patient was positioned supine, padded and secured to the bed, with both arms tucked.  The abdomen was widely prepped and draped.  A time out procedure was performed.  A 1 cm infraumbilical incision was made.  The abdomen was entered without trauma to the underlying viscera.  The abdomen was insufflated to 15 mm of Hg.  A 12 mm trocar was inserted at the periumbilical incision.  Additional 5 mm trocars were placed in the left and right abdomen.  There was no trauma to the underlying viscera.  Dissection  was more difficult due to previous prostatectomy.  There was an indirect hernia on the RIGHT.  Utilizing a transabdominal pre peritoneal technique (TAPP), a horizontal incision was made in the peritoneum, immediately below the umbilicus.  Dissection was carried out in the pre peritoneal space down to the level of the hernia sac which was reduced into the peritoneal cavity completely.  The cord contents were parietalized and preserved.  A large pre peritoneal dissection was performed to uncover the direct, indirect, femoral and obturator spaces.  Cooper's ligament was uncovered medially and the psoas muscle uncovered laterally.  The mesh, as documented above, was opened and advanced into the pre peritoneal position so that it more than adequately covered the indirect, direct, femoral and obturator spaces.  The mesh laid flat, with no inferior folds and covered the entire myopectineal orifice.  The mesh was fixated with the endo-universal hernia stapler to Cooper's ligament and the posterior aspect of the rectus muscle.  The peritoneal flap was closed with the same device.  There were no peritoneal defects or exposed mesh at the conclusion.  There was an indirect hernia on the LEFT.  Utilizing a transabdominal pre peritoneal technique (TAPP), a horizontal incision was made in the peritoneum, immediately below the umbilicus.  Dissection was carried out in the pre peritoneal space down to the level of the hernia sac which was reduced into the peritoneal cavity completely.  The cord contents were parietalized and preserved.  A large pre peritoneal dissection was performed to uncover the direct, indirect, femoral and obturator spaces.  Cooper's ligament was uncovered medially and the psoas muscle uncovered laterally.  The mesh, as documented above, was  opened and advanced into the pre peritoneal position so that it more than adequately covered the indirect, direct, femoral and obturator spaces.  The mesh laid  flat, with no inferior folds and covered the entire myopectineal orifice.  The mesh was fixated with the endo-universal hernia stapler to Cooper's ligament and the posterior aspect of the rectus muscle.  The peritoneal flap was closed with the same device.  There were no peritoneal defects or exposed mesh at the conclusion.  The umbilical trocar was removed and the fascial defect was closed with a 0 Vicryl suture.  The peritoneal cavity was completely desufflated, the trocars removed and the skin closed with 4-0 Monocryl subcuticular suture and skin glue.  All sponge and needle counts were correct at the end of the case.  Deward Foy, MD General, Bariatric, & Minimally Invasive Surgery [facility], [place]

## 2024-09-23 NOTE — Anesthesia Procedure Notes (Signed)
 Procedure Name: Intubation Date/Time: 09/23/2024 9:28 AM  Performed by: Boone Fess, MDPre-anesthesia Checklist: Patient identified, Patient being monitored, Timeout performed, Emergency Drugs available and Suction available Patient Re-evaluated:Patient Re-evaluated prior to induction Oxygen Delivery Method: Circle system utilized Preoxygenation: Pre-oxygenation with 100% oxygen Induction Type: IV induction Ventilation: Mask ventilation without difficulty Laryngoscope Size: 4 and Glidescope Grade View: Grade I Tube type: Oral Tube size: 7.0 mm Number of attempts: 3 Airway Equipment and Method: Stylet Placement Confirmation: ETT inserted through vocal cords under direct vision, positive ETCO2 and breath sounds checked- equal and bilateral Secured at: 24 cm Tube secured with: Tape Dental Injury: Teeth and Oropharynx as per pre-operative assessment  Comments: First attempt by paramedic student with no view attained. Second attempt by MD with Grade 3 view. Third attempt by MD with Glidescope resulting in grade 1 view, easy intubation.

## 2024-09-23 NOTE — Anesthesia Postprocedure Evaluation (Signed)
 Anesthesia Post Note  Patient: Cameron Nguyen  Procedure(s) Performed: REPAIR, HERNIA, INGUINAL, BILATERAL, LAPAROSCOPIC (Bilateral)     Patient location during evaluation: PACU Anesthesia Type: General Level of consciousness: awake and alert Pain management: pain level controlled Vital Signs Assessment: post-procedure vital signs reviewed and stable Respiratory status: spontaneous breathing, nonlabored ventilation, respiratory function stable and patient connected to nasal cannula oxygen Cardiovascular status: blood pressure returned to baseline and stable Postop Assessment: no apparent nausea or vomiting Anesthetic complications: no   No notable events documented.  Last Vitals:  Vitals:   09/23/24 1150 09/23/24 1200  BP: (!) 146/88 (!) 143/79  Pulse: 77 67  Resp: 16   Temp: 36.4 C   SpO2: 98% 100%    Last Pain:  Vitals:   09/23/24 1150  TempSrc: Oral  PainSc: 4                  Rome Ade

## 2024-09-23 NOTE — Anesthesia Preprocedure Evaluation (Signed)
 Anesthesia Evaluation  Patient identified by MRN, date of birth, ID band Patient awake    Reviewed: Allergy & Precautions, NPO status , Patient's Chart, lab work & pertinent test results  History of Anesthesia Complications Negative for: history of anesthetic complications  Airway Mallampati: I  TM Distance: >3 FB Neck ROM: Full    Dental no notable dental hx. (+) Teeth Intact   Pulmonary neg pulmonary ROS, neg sleep apnea, neg COPD, Patient abstained from smoking.Not current smoker   Pulmonary exam normal breath sounds clear to auscultation       Cardiovascular Exercise Tolerance: Good METS(-) hypertension(-) CAD and (-) Past MI negative cardio ROS (-) dysrhythmias  Rhythm:Regular Rate:Normal - Systolic murmurs    Neuro/Psych negative neurological ROS  negative psych ROS   GI/Hepatic ,neg GERD  ,,(+)     (-) substance abuse    Endo/Other  neg diabetes    Renal/GU negative Renal ROS     Musculoskeletal  (+) Arthritis ,    Abdominal   Peds  Hematology   Anesthesia Other Findings Past Medical History: No date: Arthritis 08/19/2017: Basal cell carcinoma     Comment:  right chest 11/29/2018: SCC (squamous cell carcinoma)     Comment:  left outer eyebrow cx3 70fu 08/20/2005: Squamous cell carcinoma of skin     Comment:  left cheek cx3 50fu  Reproductive/Obstetrics                              Anesthesia Physical Anesthesia Plan  ASA: 1  Anesthesia Plan: General   Post-op Pain Management: Tylenol  PO (pre-op)* and Toradol  IV (intra-op)*   Induction: Intravenous  PONV Risk Score and Plan: 3 and Ondansetron , Dexamethasone , Midazolam , TIVA and Propofol  infusion  Airway Management Planned: Oral ETT  Additional Equipment: None  Intra-op Plan:   Post-operative Plan: Extubation in OR  Informed Consent: I have reviewed the patients History and Physical, chart, labs and discussed  the procedure including the risks, benefits and alternatives for the proposed anesthesia with the patient or authorized representative who has indicated his/her understanding and acceptance.     Dental advisory given  Plan Discussed with: CRNA and Surgeon  Anesthesia Plan Comments: (Discussed risks of anesthesia with patient, including PONV, sore throat, lip/dental/eye damage. Rare risks discussed as well, such as cardiorespiratory and neurological sequelae, and allergic reactions. Discussed the role of CRNA in patient's perioperative care. Patient understands.)        Anesthesia Quick Evaluation

## 2024-09-23 NOTE — H&P (Signed)
   Admitting Physician: Deward PARAS Waynette Towers  Service: General Surgery  CC: Inguinal hernias  Subjective   HPI: Cameron Nguyen is an 58 y.o. male who is here for bilateral inguinal hernia repair  Past Medical History:  Diagnosis Date   Arthritis    Basal cell carcinoma 08/19/2017   right chest   SCC (squamous cell carcinoma) 11/29/2018   left outer eyebrow cx3 41fu   Squamous cell carcinoma of skin 08/20/2005   left cheek cx3 29fu    Past Surgical History:  Procedure Laterality Date   COLONOSCOPY     LYMPHADENECTOMY Bilateral 01/21/2024   Procedure: BILATERAL PELVIC LYMPHADENECTOMY;  Surgeon: Renda Glance, MD;  Location: [facility];  Service: Urology;  Laterality: Bilateral;   ROBOT ASSISTED LAPAROSCOPIC RADICAL PROSTATECTOMY N/A 01/21/2024   Procedure: XI ROBOTIC ASSISTED LAPAROSCOPIC RADICAL PROSTATECTOMY LEVEL 2;  Surgeon: Renda Glance, MD;  Location: [facility];  Service: Urology;  Laterality: N/A;  210 MINUTES NEEDED FOR CASE    Family History  Problem Relation Age of Onset   Multiple myeloma Father    Pancreatic cancer Paternal Grandmother     Social:  reports that he has never smoked. He has never used smokeless tobacco. He reports that he does not drink alcohol and does not use drugs.  Allergies: No Known Allergies  Medications: Current Outpatient Medications  Medication Instructions   aspirin EC 81 mg, Daily   tadalafil (CIALIS) 5 mg, Oral, Daily    ROS - all of the below systems have been reviewed with the patient and positives are indicated with bold text General: chills, fever or night sweats Eyes: blurry vision or double vision ENT: epistaxis or sore throat Allergy/Immunology: itchy/watery eyes or nasal congestion Hematologic/Lymphatic: bleeding problems, blood clots or swollen lymph nodes Endocrine: temperature intolerance or unexpected weight changes Breast: new or changing breast lumps or nipple discharge Resp: cough, shortness of breath, or wheezing CV:  chest pain or dyspnea on exertion GI: as per HPI GU: dysuria, trouble voiding, or hematuria MSK: joint pain or joint stiffness Neuro: TIA or stroke symptoms Derm: pruritus and skin lesion changes Psych: anxiety and depression  Objective   PE There were no vitals taken for this visit. Constitutional: NAD; conversant; no deformities Eyes: Moist conjunctiva; no lid lag; anicteric; PERRL Neck: Trachea midline; no thyromegaly Lungs: Normal respiratory effort; no tactile fremitus CV: RRR; no palpable thrills; no pitting edema GI: Abd Bilateral inguinal hernias; no palpable hepatosplenomegaly MSK: Normal range of motion of extremities; no clubbing/cyanosis Psychiatric: Appropriate affect; alert and oriented x3 Lymphatic: No palpable cervical or axillary lymphadenopathy  No results found for this or any previous visit (from the past 24 hours).  Imaging Orders  No imaging studies ordered today     Assessment and Plan   Cameron Nguyen is an 58 y.o. male with bilateral inguinal hernias.  He has a history of prostate surgery.  I recommended laparoscopic, possibly open, bilateral inguinal hernia repair with mesh.  We discussed the procedure, its risks, benefits and alternatives and the patient granted consent to proceed.  Deward PARAS Foy, MD  [facility] Use [email or web address] to contact on call provider

## 2024-09-23 NOTE — Transfer of Care (Signed)
 Immediate Anesthesia Transfer of Care Note  Patient: Cameron Nguyen  Procedure(s) Performed: REPAIR, HERNIA, INGUINAL, BILATERAL, LAPAROSCOPIC (Bilateral)  Patient Location: PACU  Anesthesia Type:General  Level of Consciousness: drowsy and patient cooperative  Airway & Oxygen Therapy: Patient Spontanous Breathing and Patient connected to face mask oxygen  Post-op Assessment: Report given to RN and Post -op Vital signs reviewed and stable  Post vital signs: Reviewed and stable  Last Vitals:  Vitals Value Taken Time  BP 154/77 09/23/24 11:07  Temp    Pulse 69 09/23/24 11:10  Resp 18 09/23/24 11:10  SpO2 100 % 09/23/24 11:10  Vitals shown include unfiled device data.  Last Pain:  Vitals:   09/23/24 0838  TempSrc: Oral  PainSc:          Complications: No notable events documented.

## 2024-09-25 ENCOUNTER — Encounter (HOSPITAL_COMMUNITY): Payer: Self-pay | Admitting: Surgery
